# Patient Record
Sex: Male | Born: 1990 | Race: Black or African American | Hispanic: No | Marital: Single | Smoking: Former smoker
Health system: Southern US, Community
[De-identification: ages and names within clinical notes are randomized; demographics above are authoritative.]

## PROBLEM LIST (undated history)

## (undated) DIAGNOSIS — R7303 Prediabetes: Secondary | ICD-10-CM

## (undated) DIAGNOSIS — E559 Vitamin D deficiency, unspecified: Secondary | ICD-10-CM

## (undated) HISTORY — DX: Prediabetes: R73.03

## (undated) HISTORY — PX: HERNIA REPAIR: SHX51

## (undated) HISTORY — DX: Vitamin D deficiency, unspecified: E55.9

---

## 2014-07-15 ENCOUNTER — Emergency Department (HOSPITAL_COMMUNITY)
Admission: EM | Admit: 2014-07-15 | Discharge: 2014-07-15 | Disposition: A | Discharge status: Home / Self Care | Source: Home / Self Care | Attending: Emergency Medicine | Admitting: Emergency Medicine

## 2014-07-15 ENCOUNTER — Ambulatory Visit (HOSPITAL_COMMUNITY): Attending: Emergency Medicine

## 2014-07-15 ENCOUNTER — Encounter (HOSPITAL_COMMUNITY): Payer: Self-pay | Admitting: Emergency Medicine

## 2014-07-15 DIAGNOSIS — R062 Wheezing: Secondary | ICD-10-CM | POA: Diagnosis not present

## 2014-07-15 DIAGNOSIS — R05 Cough: Secondary | ICD-10-CM | POA: Insufficient documentation

## 2014-07-15 DIAGNOSIS — J189 Pneumonia, unspecified organism: Secondary | ICD-10-CM

## 2014-07-15 LAB — POCT RAPID STREP A: Streptococcus, Group A Screen (Direct): NEGATIVE

## 2014-07-15 MED ORDER — PREDNISONE 20 MG PO TABS
ORAL_TABLET | ORAL | Status: AC
  Filled 2014-07-15: qty 3

## 2014-07-15 MED ORDER — PREDNISONE 10 MG PO TABS: ORAL_TABLET | ORAL | Status: DC

## 2014-07-15 MED ORDER — DOXYCYCLINE HYCLATE 100 MG PO CAPS: 100.0000 mg | ORAL_CAPSULE | Freq: Two times a day (BID) | ORAL | Status: DC

## 2014-07-15 MED ORDER — ALBUTEROL SULFATE (2.5 MG/3ML) 0.083% IN NEBU
INHALATION_SOLUTION | RESPIRATORY_TRACT | Status: AC
  Filled 2014-07-15: qty 6

## 2014-07-15 MED ORDER — ALBUTEROL SULFATE HFA 108 (90 BASE) MCG/ACT IN AERS: 2.0000 | INHALATION_SPRAY | RESPIRATORY_TRACT | Status: DC | PRN

## 2014-07-15 MED ORDER — IPRATROPIUM BROMIDE 0.02 % IN SOLN
0.5000 mg | Freq: Once | RESPIRATORY_TRACT | Status: AC
  Administered 2014-07-15: 0.5 mg via RESPIRATORY_TRACT

## 2014-07-15 MED ORDER — ALBUTEROL SULFATE (2.5 MG/3ML) 0.083% IN NEBU
5.0000 mg | INHALATION_SOLUTION | Freq: Once | RESPIRATORY_TRACT | Status: AC
  Administered 2014-07-15: 5 mg via RESPIRATORY_TRACT

## 2014-07-15 MED ORDER — PREDNISONE 20 MG PO TABS
60.0000 mg | ORAL_TABLET | Freq: Once | ORAL | Status: AC
  Administered 2014-07-15: 60 mg via ORAL

## 2014-07-15 MED ORDER — IPRATROPIUM BROMIDE 0.02 % IN SOLN
RESPIRATORY_TRACT | Status: AC
  Filled 2014-07-15: qty 2.5

## 2014-07-15 MED ORDER — AEROCHAMBER PLUS FLO-VU LARGE MISC: 1.0000 | Freq: Once | Status: DC

## 2014-07-15 NOTE — ED Notes (Signed)
Pt started with a sore throat last Friday, about three days later it progressed into nasal congestion and then to a bad cough.  Pt states he has moments of sweating then chills.

## 2014-07-15 NOTE — Discharge Instructions (Signed)

## 2014-07-15 NOTE — ED Provider Notes (Signed)
CSN: [id number]     Arrival date & time 07/15/14  1147 History   First MD Initiated Contact with Patient 07/15/14 1229     Chief Complaint  Patient presents with  . Sore Throat  . Nasal Congestion  . Cough  . Chills   (Consider location/radiation/quality/duration/timing/severity/associated sxs/prior Treatment) HPI          23 year old male presents for evaluation of being sick for one week. This started one week ago with sore throat and progressed quickly into cough and congestion. He feels like his symptoms were getting worse for the first 4 days but have been constant since then. He admits to occasional shortness of breath, worse with lying flat. No chest pain. He has intermittent subjective fevers. He has been taking over-the-counter medication without relief. No recent travel or sick contacts. No past medical history   History reviewed. No pertinent past medical history. History reviewed. No pertinent past surgical history. History reviewed. No pertinent family history. History  Substance Use Topics  . Smoking status: Current Every Day Smoker -- 0.25 packs/day  . Smokeless tobacco: Not on file  . Alcohol Use: Yes     Comment: socially    Review of Systems  Constitutional: Positive for fever, chills and fatigue.  HENT: Positive for congestion, rhinorrhea, sinus pressure and sore throat. Negative for ear pain.   Respiratory: Positive for cough, chest tightness and shortness of breath.   Cardiovascular: Negative for chest pain and palpitations.  Gastrointestinal: Negative for nausea, vomiting, abdominal pain and diarrhea.  Skin: Negative for rash.  All other systems reviewed and are negative.   Allergies  Review of patient's allergies indicates no known allergies.  Home Medications   Prior to Admission medications   Medication Sig Start Date End Date Taking? Authorizing Provider  albuterol (PROVENTIL HFA;VENTOLIN HFA) 108 (90 BASE) MCG/ACT inhaler Inhale 2 puffs into the  lungs every 4 (four) hours as needed for wheezing. 07/15/14   Adrian BlackwaterZachary H Chetan Mehring, PA-C  Chlorphen-Pseudoephed-APAP (THERAFLU FLU/COLD/SORE THROAT PO) Take by mouth.   Yes Historical Provider, MD  doxycycline (VIBRAMYCIN) 100 MG capsule Take 1 capsule (100 mg total) by mouth 2 (two) times daily. 07/15/14   Adrian BlackwaterZachary H Sharman Garrott, PA-C  ibuprofen (ADVIL,MOTRIN) 600 MG tablet Take 600 mg by mouth every 6 (six) hours as needed.   Yes Historical Provider, MD  predniSONE (DELTASONE) 10 MG tablet 4 tabs PO QD for 4 days; 3 tabs PO QD for 3 days; 2 tabs PO QD for 2 days; 1 tab PO QD for 1 day 07/15/14   Graylon GoodZachary H Anasofia Micallef, PA-C  Spacer/Aero-Holding Chambers (AEROCHAMBER PLUS FLO-VU LARGE) MISC 1 each by Other route once. 07/15/14   Adrian BlackwaterZachary H Jerame Hedding, PA-C   BP 124/76 mmHg  Pulse 89  Temp(Src) 99 F (37.2 C) (Oral)  Resp 16  SpO2 99% Physical Exam  Constitutional: He is oriented to person, place, and time. He appears well-developed and well-nourished. No distress.  HENT:  Head: Normocephalic and atraumatic.  Right Ear: External ear normal.  Left Ear: External ear normal.  Nose: Nose normal. Right sinus exhibits no maxillary sinus tenderness and no frontal sinus tenderness. Left sinus exhibits no maxillary sinus tenderness and no frontal sinus tenderness.  Mouth/Throat: Oropharyngeal exudate (erythema, exudate ) present.  Eyes: Conjunctivae are normal. Right eye exhibits no discharge. Left eye exhibits no discharge.  Neck: Normal range of motion. Neck supple.  Cardiovascular: Normal rate, regular rhythm and normal heart sounds.   Pulmonary/Chest: Effort normal. No respiratory distress.  He has wheezes (diffuse). He has rales (LLL). He exhibits no tenderness.  Neurological: He is alert and oriented to person, place, and time. Coordination normal.  Skin: Skin is warm and dry. No rash noted. He is not diaphoretic.  Psychiatric: He has a normal mood and affect. Judgment normal.  Nursing note and vitals  reviewed.   ED Course  Procedures (including critical care time) Labs Review Labs Reviewed  CULTURE, GROUP A STREP  POCT RAPID STREP A ([facility] ONLY)    Imaging Review Dg Chest 2 View  07/15/2014   CLINICAL DATA:  Cough and wheezing for 1 week.  EXAM: CHEST  2 VIEW  COMPARISON:  None.  FINDINGS: The heart size and mediastinal contours are within normal limits. Both lungs are clear. The visualized skeletal structures are unremarkable.  IMPRESSION: Normal exam.   Electronically Signed   By: Geanie CooleyJim  Maxwell M.D.   On: 07/15/2014 14:25     MDM   1. Walking pneumonia    after 10 mg of albuterol and 1 mg of Atrovent, wheezing has resolved slightly but now with significant rhonchi in the left lower lobe. We'll treat for atypical pneumonia with doxycycline. Also prednisone and albuterol. Follow-up if no improvement in a few days   Meds ordered this encounter  Medications  . Chlorphen-Pseudoephed-APAP (THERAFLU FLU/COLD/SORE THROAT PO)    Sig: Take by mouth.  Marland Kitchen. ibuprofen (ADVIL,MOTRIN) 600 MG tablet    Sig: Take 600 mg by mouth every 6 (six) hours as needed.  Marland Kitchen. albuterol (PROVENTIL) (2.5 MG/3ML) 0.083% nebulizer solution 5 mg    Sig:   . ipratropium (ATROVENT) nebulizer solution 0.5 mg    Sig:   . albuterol (PROVENTIL) (2.5 MG/3ML) 0.083% nebulizer solution 5 mg    Sig:   . ipratropium (ATROVENT) nebulizer solution 0.5 mg    Sig:   . predniSONE (DELTASONE) tablet 60 mg    Sig:   . DISCONTD: predniSONE (DELTASONE) 10 MG tablet    Sig: 4 tabs PO QD for 4 days; 3 tabs PO QD for 3 days; 2 tabs PO QD for 2 days; 1 tab PO QD for 1 day    Dispense:  30 tablet    Refill:  0    Order Specific Question:  Supervising Provider    Answer:  Linna HoffKINDL, JAMES D [phone][[id number]]  . DISCONTD: doxycycline (VIBRAMYCIN) 100 MG capsule    Sig: Take 1 capsule (100 mg total) by mouth 2 (two) times daily.    Dispense:  14 capsule    Refill:  0    Order Specific Question:  Supervising Provider    Answer:  Linna HoffKINDL,  JAMES D [phone][[id number]]  . predniSONE (DELTASONE) 10 MG tablet    Sig: 4 tabs PO QD for 4 days; 3 tabs PO QD for 3 days; 2 tabs PO QD for 2 days; 1 tab PO QD for 1 day    Dispense:  30 tablet    Refill:  0    Order Specific Question:  Supervising Provider    Answer:  Linna HoffKINDL, JAMES D [phone][[id number]]  . doxycycline (VIBRAMYCIN) 100 MG capsule    Sig: Take 1 capsule (100 mg total) by mouth 2 (two) times daily.    Dispense:  14 capsule    Refill:  0    Order Specific Question:  Supervising Provider    Answer:  Linna HoffKINDL, JAMES D [phone][[id number]]  . albuterol (PROVENTIL HFA;VENTOLIN HFA) 108 (90 BASE) MCG/ACT inhaler    Sig: Inhale 2 puffs into  the lungs every 4 (four) hours as needed for wheezing.    Dispense:  1 Inhaler    Refill:  0    Order Specific Question:  Supervising Provider    Answer:  Bradd Canary D [id number]  . Spacer/Aero-Holding Chambers (AEROCHAMBER PLUS FLO-VU LARGE) MISC    Sig: 1 each by Other route once.    Dispense:  1 each    Refill:  0    Order Specific Question:  Supervising Provider    Answer:  Bradd Canary D [[id number]]     Graylon Good, PA-C 07/15/14 [phone]

## 2014-07-17 LAB — CULTURE, GROUP A STREP

## 2015-10-22 IMAGING — CR DG CHEST 2V
2 series · 2 of 2 positions shown · non-contrast
Comparison: None.

CLINICAL DATA: Cough and wheezing for 1 week.

EXAM:
CHEST  2 VIEW

[w chest pa]
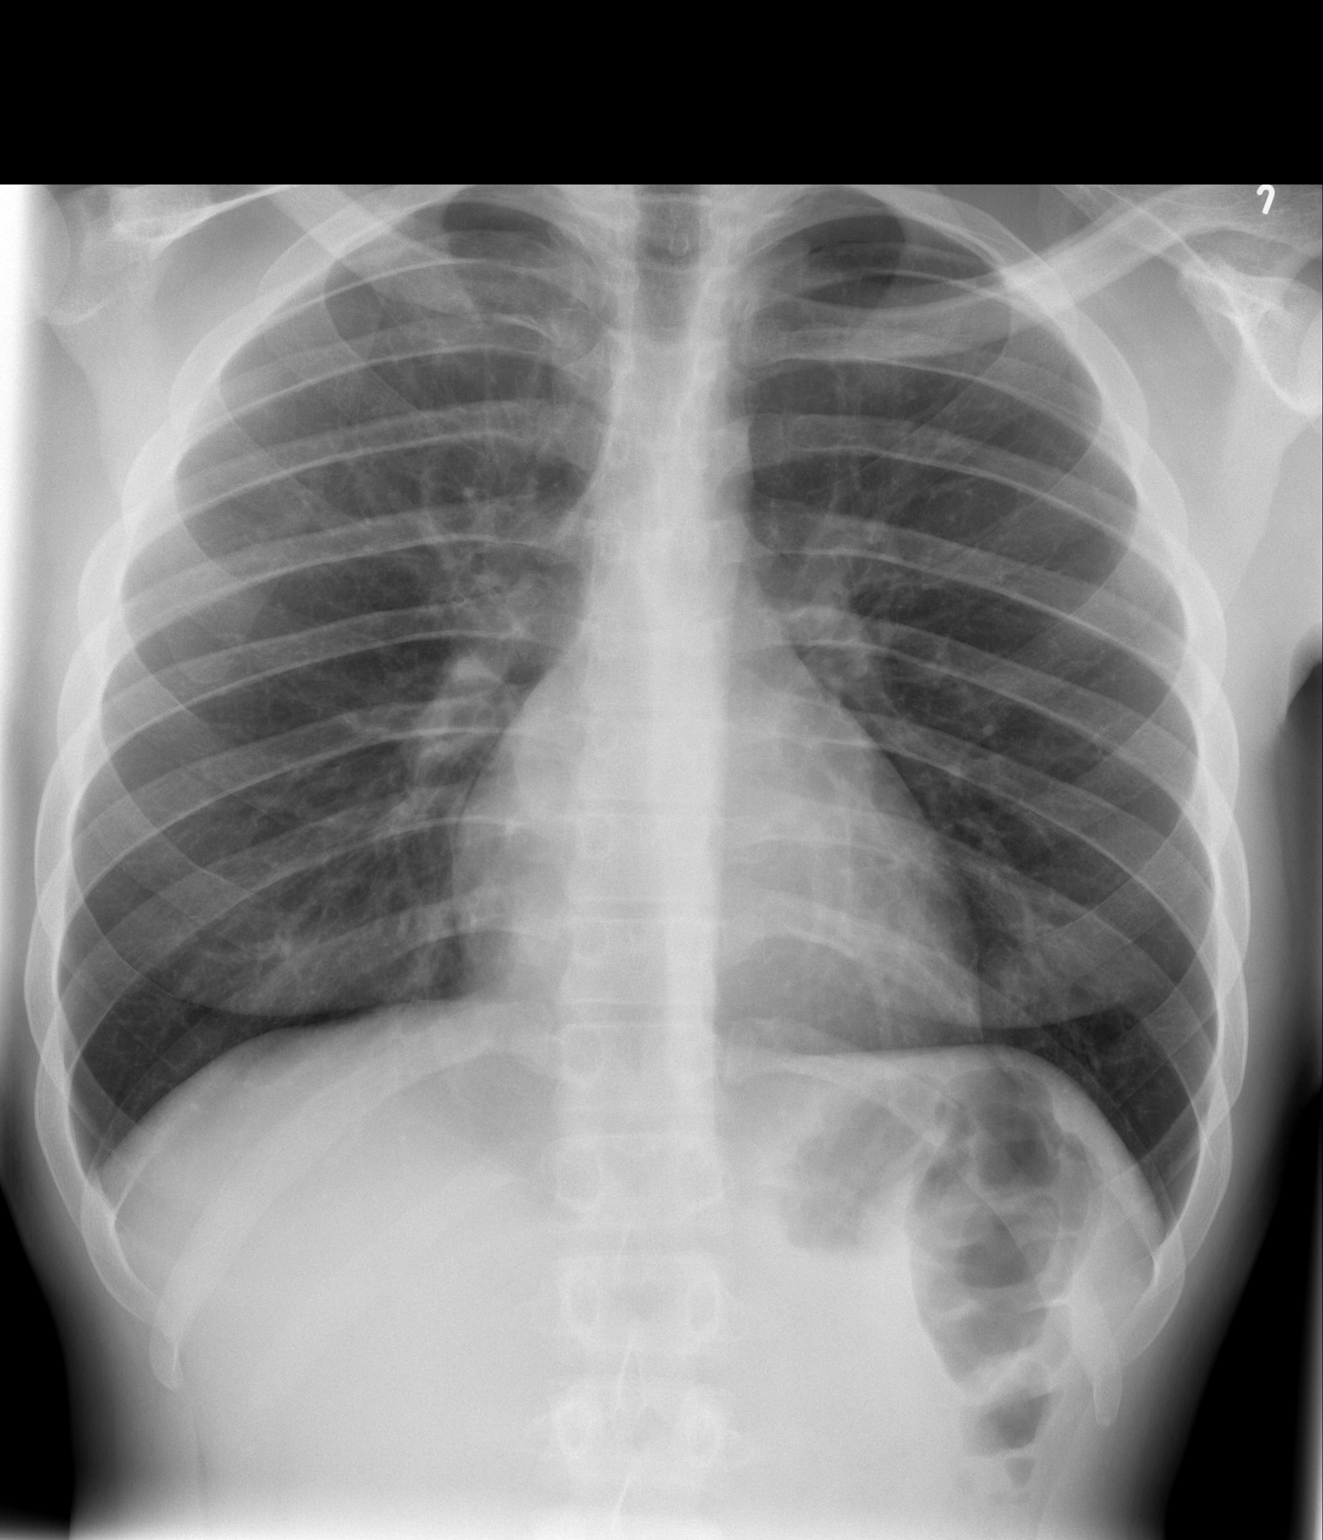

[w chest lat]
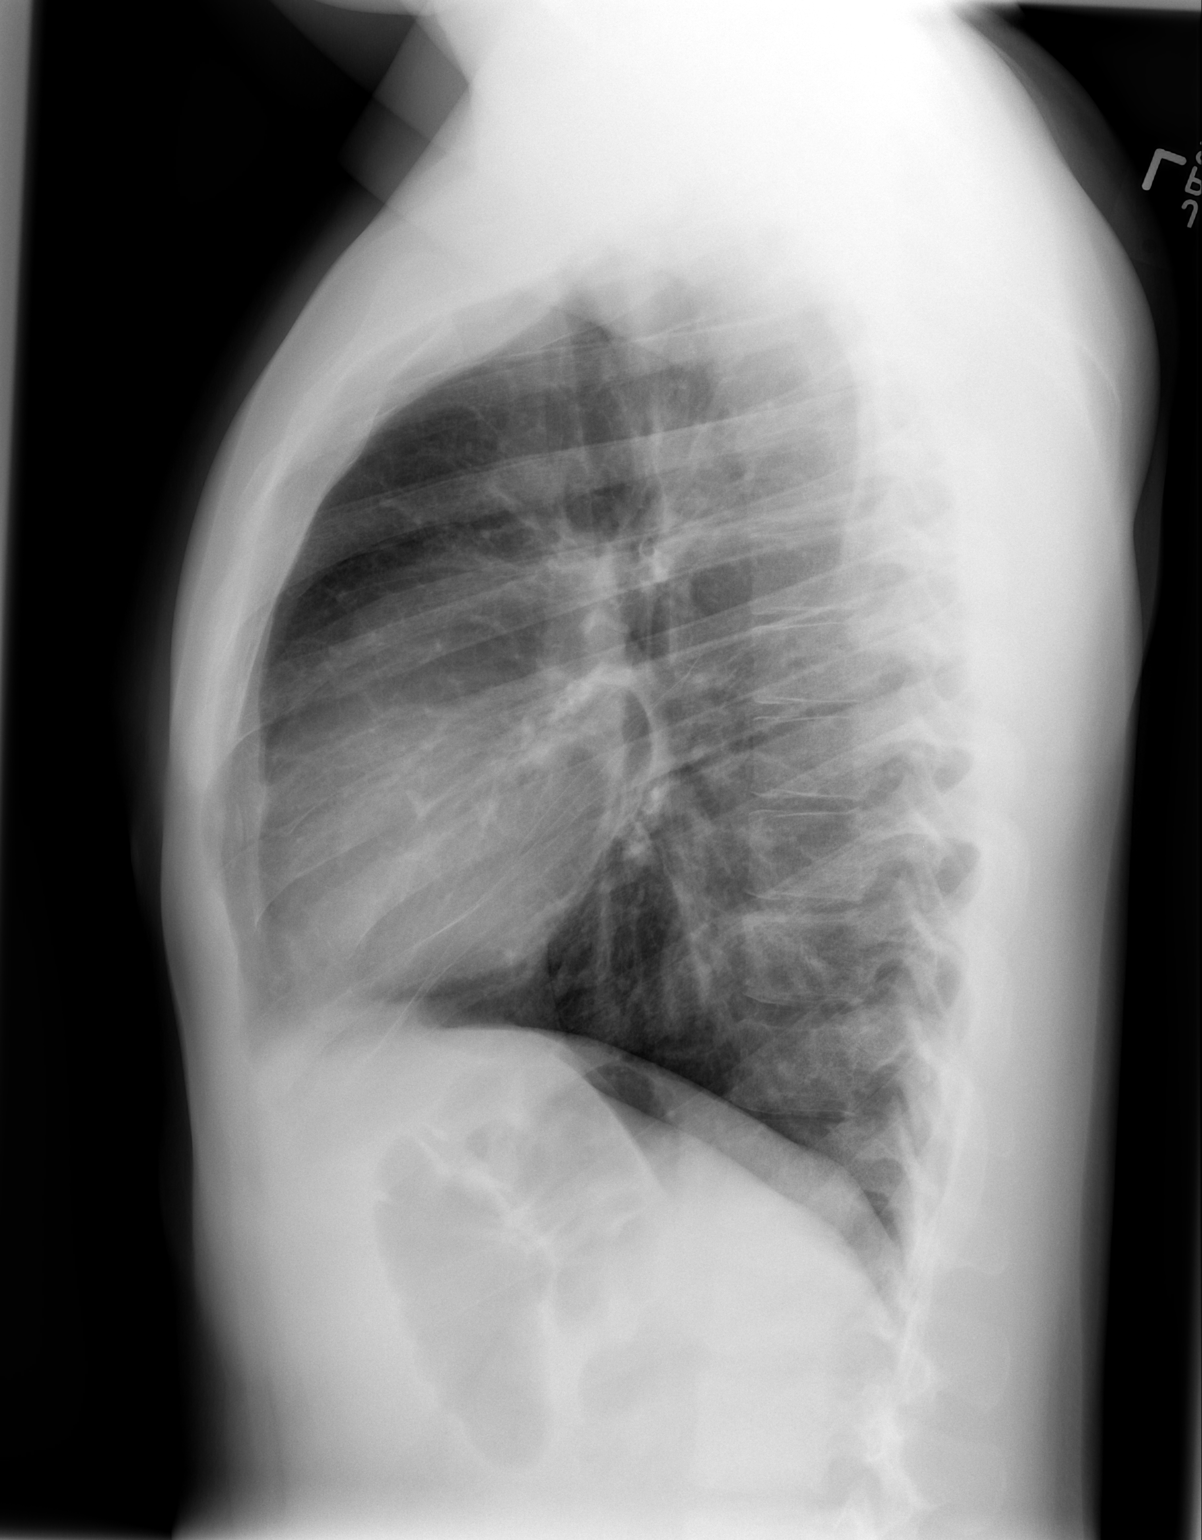

[2 of 2 positions shown; findings below may reference images not displayed]

FINDINGS: The heart size and mediastinal contours are within normal limits.
Both lungs are clear. The visualized skeletal structures are
unremarkable.
IMPRESSION: Normal exam.

## 2016-12-27 ENCOUNTER — Ambulatory Visit (HOSPITAL_COMMUNITY)
Admission: EM | Admit: 2016-12-27 | Discharge: 2016-12-27 | Disposition: A | Discharge status: Home / Self Care | Payer: PPO | Attending: Family Medicine | Admitting: Family Medicine

## 2016-12-27 ENCOUNTER — Encounter (HOSPITAL_COMMUNITY): Payer: Self-pay | Admitting: *Deleted

## 2016-12-27 DIAGNOSIS — A084 Viral intestinal infection, unspecified: Secondary | ICD-10-CM

## 2016-12-27 MED ORDER — ONDANSETRON 4 MG PO TBDP
4.0000 mg | ORAL_TABLET | Freq: Once | ORAL | Status: AC
  Administered 2016-12-27: 4 mg via ORAL

## 2016-12-27 MED ORDER — ONDANSETRON 4 MG PO TBDP
ORAL_TABLET | ORAL | Status: AC
  Filled 2016-12-27: qty 1

## 2016-12-27 MED ORDER — ONDANSETRON HCL 8 MG PO TABS: 8.0000 mg | ORAL_TABLET | Freq: Three times a day (TID) | ORAL | 0 refills | Status: DC | PRN

## 2016-12-27 NOTE — ED Triage Notes (Signed)
Pt  Reports   Symptoms  Of  Nausea    With   Decreased   Appetite   X  3  Days  Pt  Has  Dry  Heaves    No  Diarrhea      As  A  Matter  Of fact     Pt    Reports  Has  Not had  A  bm in  3  Days     Pt  Is  Awake  And  Alert

## 2016-12-27 NOTE — ED Provider Notes (Signed)
[facility]    CSN: [id number] Arrival date & time: 12/27/16  1000     History   Chief Complaint Chief Complaint  Patient presents with  . Nausea    HPI Kenneth Chang is a 26 y.o. male presenting for nausea.   He reports 3 days of intermittent nausea with only 1 associated episode of vomiting (nonbloody nonbilious). He has been able to hold down liquids and some doritos. No abd pain, diarrhea or constipation. No sick contacts or suspicious ingestions. Smokes tobacco daily, drinks occasionally and smokes "a lot" of pot daily.   HPI  History reviewed. No pertinent past medical history.  There are no active problems to display for this patient.   History reviewed. No pertinent surgical history.     Home Medications    Prior to Admission medications   Medication Sig Start Date End Date Taking? Authorizing Provider  albuterol (PROVENTIL HFA;VENTOLIN HFA) 108 (90 BASE) MCG/ACT inhaler Inhale 2 puffs into the lungs every 4 (four) hours as needed for wheezing. 07/15/14   Adrian Blackwater Baker, PA-C  ibuprofen (ADVIL,MOTRIN) 600 MG tablet Take 600 mg by mouth every 6 (six) hours as needed.    Historical Provider, MD  ondansetron (ZOFRAN) 8 MG tablet Take 1 tablet (8 mg total) by mouth every 8 (eight) hours as needed for nausea or vomiting. 12/27/16   Tyrone Nine, MD    Family History No family history on file.  Social History Social History  Substance Use Topics  . Smoking status: Current Every Day Smoker    Packs/day: 0.25  . Smokeless tobacco: Not on file  . Alcohol use Yes     Comment: socially     Allergies   Patient has no known allergies.   Review of Systems Review of Systems Per HPI  Physical Exam Triage Vital Signs ED Triage Vitals [12/27/16 1015]  Enc Vitals Group     BP (!) 158/70     Pulse Rate (!) 58     Resp 18     Temp 98.6 F (37 C)     Temp Source Oral     SpO2 99 %     Weight      Height      Head Circumference      Peak Flow       Pain Score      Pain Loc      Pain Edu?      Excl. in GC?    No data found.   Updated Vital Signs BP (!) 158/70 (BP Location: Right Arm)   Pulse (!) 58   Temp 98.6 F (37 C) (Oral)   Resp 18   SpO2 99%   Visual Acuity Right Eye Distance:   Left Eye Distance:   Bilateral Distance:    Right Eye Near:   Left Eye Near:    Bilateral Near:     Physical Exam  Constitutional: He is oriented to person, place, and time. He appears well-developed and well-nourished. No distress.  Eyes: EOM are normal. Pupils are equal, round, and reactive to light. No scleral icterus.  Neck: Neck supple. No JVD present.  Cardiovascular: Normal rate, regular rhythm, normal heart sounds and intact distal pulses.   No murmur heard. Pulmonary/Chest: Effort normal and breath sounds normal. No respiratory distress.  Abdominal: Soft. Bowel sounds are normal. He exhibits no distension. There is no tenderness.  Musculoskeletal: Normal range of motion. He exhibits no edema or tenderness.  Lymphadenopathy:  He has no cervical adenopathy.  Neurological: He is alert and oriented to person, place, and time. He exhibits normal muscle tone.  Skin: Skin is warm and dry.  Vitals reviewed.    UC Treatments / Results  Labs (all labs ordered are listed, but only abnormal results are displayed) Labs Reviewed - No data to display  EKG  EKG Interpretation None       Radiology No results found.  Procedures Procedures (including critical care time)  Medications Ordered in UC Medications  ondansetron (ZOFRAN-ODT) disintegrating tablet 4 mg (not administered)     Initial Impression / Assessment and Plan / UC Course  I have reviewed the triage vital signs and the nursing notes.  Pertinent labs & imaging results that were available during my care of the patient were reviewed by me and considered in my medical decision making (see chart for details).  Final Clinical Impressions(s) / UC Diagnoses     Final diagnoses:  Viral gastroenteritis   26 y.o. male presenting for viral gastroenteritis symptoms without evidence of dysentery. No evidence of dehydration with stable vital signs and benign exam. Also discussed possibility of cannabinoid hyperemesis with the patient. Though this is not the most likely diagnosis, it was recommended that he quit tobacco and marijuana.  - Zofran provided in clinic with relief and prescription provided.  - Hydration precautions given.   New Prescriptions New Prescriptions   ONDANSETRON (ZOFRAN) 8 MG TABLET    Take 1 tablet (8 mg total) by mouth every 8 (eight) hours as needed for nausea or vomiting.     Tyrone Nineyan B Ellington Cornia, MD 12/27/16 1037

## 2016-12-31 ENCOUNTER — Emergency Department (HOSPITAL_COMMUNITY)
Admission: EM | Admit: 2016-12-31 | Discharge: 2016-12-31 | Disposition: A | Discharge status: Home / Self Care | Payer: BLUE CROSS/BLUE SHIELD | Attending: Emergency Medicine | Admitting: Emergency Medicine

## 2016-12-31 ENCOUNTER — Encounter (HOSPITAL_COMMUNITY): Payer: Self-pay | Admitting: *Deleted

## 2016-12-31 DIAGNOSIS — I10 Essential (primary) hypertension: Secondary | ICD-10-CM | POA: Diagnosis not present

## 2016-12-31 DIAGNOSIS — R112 Nausea with vomiting, unspecified: Secondary | ICD-10-CM | POA: Diagnosis present

## 2016-12-31 DIAGNOSIS — F17200 Nicotine dependence, unspecified, uncomplicated: Secondary | ICD-10-CM | POA: Diagnosis not present

## 2016-12-31 DIAGNOSIS — Z79899 Other long term (current) drug therapy: Secondary | ICD-10-CM | POA: Diagnosis not present

## 2016-12-31 DIAGNOSIS — E876 Hypokalemia: Secondary | ICD-10-CM | POA: Diagnosis not present

## 2016-12-31 LAB — COMPREHENSIVE METABOLIC PANEL
ALT: 14 U/L — ABNORMAL LOW (ref 17–63)
AST: 13 U/L — ABNORMAL LOW (ref 15–41)
Albumin: 4.4 g/dL (ref 3.5–5.0)
Alkaline Phosphatase: 47 U/L (ref 38–126)
Anion gap: 13 (ref 5–15)
BUN: 16 mg/dL (ref 6–20)
CALCIUM: 9.5 mg/dL (ref 8.9–10.3)
CHLORIDE: 98 mmol/L — AB (ref 101–111)
CO2: 23 mmol/L (ref 22–32)
Creatinine, Ser: 1.14 mg/dL (ref 0.61–1.24)
GFR calc non Af Amer: >60 mL/min (ref >60)
Glucose, Bld: 111 mg/dL — ABNORMAL HIGH (ref 65–99)
Potassium: 3.1 mmol/L — ABNORMAL LOW (ref 3.5–5.1)
SODIUM: 134 mmol/L — AB (ref 135–145)
Total Bilirubin: 1 mg/dL (ref 0.3–1.2)
Total Protein: 7.4 g/dL (ref 6.5–8.1)

## 2016-12-31 LAB — CBC
HCT: 44.3 % (ref 39.0–52.0)
Hemoglobin: 15.4 g/dL (ref 13.0–17.0)
MCH: 26.5 pg (ref 26.0–34.0)
MCHC: 34.8 g/dL (ref 30.0–36.0)
MCV: 76.1 fL — AB (ref 78.0–100.0)
Platelets: 257 10*3/uL (ref 150–400)
RBC: 5.82 MIL/uL — AB (ref 4.22–5.81)
RDW: 12.9 % (ref 11.5–15.5)
WBC: 7.6 10*3/uL (ref 4.0–10.5)

## 2016-12-31 LAB — LIPASE, BLOOD: Lipase: 37 U/L (ref 11–51)

## 2016-12-31 MED ORDER — PROMETHAZINE HCL 25 MG PO TABS: 25.0000 mg | ORAL_TABLET | Freq: Four times a day (QID) | ORAL | 0 refills | Status: DC | PRN

## 2016-12-31 MED ORDER — POTASSIUM CHLORIDE CRYS ER 20 MEQ PO TBCR
20.0000 meq | EXTENDED_RELEASE_TABLET | Freq: Once | ORAL | Status: AC
  Administered 2016-12-31: 20 meq via ORAL
  Filled 2016-12-31: qty 1

## 2016-12-31 MED ORDER — PROMETHAZINE HCL 25 MG/ML IJ SOLN
25.0000 mg | Freq: Once | INTRAMUSCULAR | Status: AC
  Administered 2016-12-31: 25 mg via INTRAVENOUS
  Filled 2016-12-31: qty 1

## 2016-12-31 MED ORDER — SODIUM CHLORIDE 0.9 % IV BOLUS (SEPSIS)
1000.0000 mL | Freq: Once | INTRAVENOUS | Status: AC
  Administered 2016-12-31: 1000 mL via INTRAVENOUS

## 2016-12-31 NOTE — ED Notes (Signed)
Aware he needs to get a urine spec. Unable to do so now

## 2016-12-31 NOTE — ED Notes (Signed)
Pt did not need anything at this time  

## 2016-12-31 NOTE — ED Provider Notes (Addendum)
[facility]   CSN: [id number] Arrival date & time: 12/31/16  4098     History   Chief Complaint Chief Complaint  Patient presents with  . Abdominal Pain  . Nausea  . Emesis    HPI Phoenyx Paulsen is a 26 y.o. male.  HPI  26 year old male presents today complaining of nausea and vomiting for the past week. He denies any pain, fever, body aches, or diarrhea. He has not had similar episodes in the past. He describes nonbloody nonbilious emesis  he was seen in urgent care earlier in the week for this and given Zofran which has not relieved him. He denies any headache, chest pain, dyspnea, cough, flank pain, urinary tract infection symptoms, or rashes. No known sick contacts. He does endorse smoking marijuana heavily on a daily basis.  History reviewed. No pertinent past medical history.  There are no active problems to display for this patient.   Past Surgical History:  Procedure Laterality Date  . HERNIA REPAIR         Home Medications    Prior to Admission medications   Medication Sig Start Date End Date Taking? Authorizing Provider  albuterol (PROVENTIL HFA;VENTOLIN HFA) 108 (90 BASE) MCG/ACT inhaler Inhale 2 puffs into the lungs every 4 (four) hours as needed for wheezing. 07/15/14   Baker, Adrian Blackwater, PA-C  ibuprofen (ADVIL,MOTRIN) 600 MG tablet Take 600 mg by mouth every 6 (six) hours as needed.    [provider]  ondansetron (ZOFRAN) 8 MG tablet Take 1 tablet (8 mg total) by mouth every 8 (eight) hours as needed for nausea or vomiting. 12/27/16   Tyrone Nine, MD    Family History No family history on file.  Social History Social History  Substance Use Topics  . Smoking status: Current Every Day Smoker    Packs/day: 0.25  . Smokeless tobacco: Never Used  . Alcohol use Yes     Comment: socially     Allergies   Patient has no known allergies.   Review of Systems Review of Systems  All other systems reviewed and are  negative.    Physical Exam Updated Vital Signs BP (!) 161/98 (BP Location: Left Arm)   Pulse (!) 58   Temp 98.8 F (37.1 C) (Oral)   Resp 16   SpO2 100%   Physical Exam  Constitutional: He is oriented to person, place, and time. He appears well-developed and well-nourished. He appears distressed.  HENT:  Head: Normocephalic.  Eyes: Pupils are equal, round, and reactive to light.  Neck: Normal range of motion. Neck supple.  Cardiovascular: Normal rate and regular rhythm.   Pulmonary/Chest: Effort normal and breath sounds normal.  Abdominal: Soft. Bowel sounds are normal. He exhibits no distension and no mass. There is no tenderness. There is no guarding.  Musculoskeletal: Normal range of motion.  Neurological: He is alert and oriented to person, place, and time.  Skin: Skin is warm. Capillary refill takes less than 2 seconds.  Psychiatric: He has a normal mood and affect.  Nursing note and vitals reviewed.    ED Treatments / Results  Labs (all labs ordered are listed, but only abnormal results are displayed) Labs Reviewed  CBC - Abnormal; Notable for the following:       Result Value   RBC 5.82 (*)    MCV 76.1 (*)    All other components within normal limits  LIPASE, BLOOD  COMPREHENSIVE METABOLIC PANEL  URINALYSIS, ROUTINE W REFLEX MICROSCOPIC  EKG  EKG Interpretation None       Radiology No results found.  Procedures Procedures (including critical care time)  Medications Ordered in ED Medications  sodium chloride 0.9 % bolus 1,000 mL (not administered)  promethazine (PHENERGAN) injection 25 mg (not administered)     Initial Impression / Assessment and Plan / ED Course  I have reviewed the triage vital signs and the nursing notes.  Pertinent labs & imaging results that were available during my care of the patient were reviewed by me and considered in my medical decision making (see chart for details).   26 year old male presents complaining of  nausea and vomiting for a week. He is hydrated here with a liter of normal saline. Labs are obtained and reviewed. His custody 1 use and need for cessation assistance may represent cannabinoid hyperemesis syndrome.  Patient with systolic blood pressures up to 160. These have been present on multiple repeat evaluations. Patient advised to have outpatient follow-up for hypertension. Patient tolerating fluids well.  Plan oral potassium repletion for mild hypokalemia f/b dietary repletion.  Plan phenergan, clear liquids, thc cessation.  Patient voices understanding.  Final Clinical Impressions(s) / ED Diagnoses   Final diagnoses:  Nausea and vomiting, intractability of vomiting not specified, unspecified vomiting type  Hypokalemia  Hypertension, unspecified type    New Prescriptions New Prescriptions   No medications on file     Margarita Grizzleay, Vernetta Dizdarevic, MD 12/31/16 [id number]    Margarita Grizzleay, Murl Zogg, MD 12/31/16 [phone]

## 2016-12-31 NOTE — ED Notes (Signed)
PT did not have to use RR at this time  

## 2016-12-31 NOTE — ED Triage Notes (Signed)
c/o nausea and vomiting onset 1 week ago states he was seen at urgent care for same , was given zofran not helping.

## 2016-12-31 NOTE — ED Notes (Signed)
Pt states he understands instructions. Home stable with girtlfriend.

## 2016-12-31 NOTE — ED Notes (Signed)
Taking po fluids and tolerating well.

## 2017-04-02 ENCOUNTER — Emergency Department (HOSPITAL_COMMUNITY)
Admission: EM | Admit: 2017-04-02 | Discharge: 2017-04-02 | Disposition: A | Discharge status: Home / Self Care | Payer: Self-pay

## 2017-04-04 ENCOUNTER — Encounter (HOSPITAL_COMMUNITY): Payer: Self-pay | Admitting: Nurse Practitioner

## 2017-04-04 DIAGNOSIS — F12988 Cannabis use, unspecified with other cannabis-induced disorder: Secondary | ICD-10-CM | POA: Insufficient documentation

## 2017-04-04 DIAGNOSIS — F17200 Nicotine dependence, unspecified, uncomplicated: Secondary | ICD-10-CM | POA: Diagnosis not present

## 2017-04-04 DIAGNOSIS — R112 Nausea with vomiting, unspecified: Secondary | ICD-10-CM | POA: Diagnosis present

## 2017-04-04 LAB — COMPREHENSIVE METABOLIC PANEL
ALBUMIN: 4.3 g/dL (ref 3.5–5.0)
ALK PHOS: 40 U/L (ref 38–126)
ALT: 37 U/L (ref 17–63)
AST: 83 U/L — AB (ref 15–41)
Anion gap: 13 (ref 5–15)
BILIRUBIN TOTAL: 1.3 mg/dL — AB (ref 0.3–1.2)
BUN: 18 mg/dL (ref 6–20)
CALCIUM: 9.5 mg/dL (ref 8.9–10.3)
CO2: 24 mmol/L (ref 22–32)
Chloride: 103 mmol/L (ref 101–111)
Creatinine, Ser: 1.44 mg/dL — ABNORMAL HIGH (ref 0.61–1.24)
GFR calc Af Amer: >60 mL/min (ref >60)
GFR calc non Af Amer: >60 mL/min (ref >60)
GLUCOSE: 119 mg/dL — AB (ref 65–99)
Potassium: 3.3 mmol/L — ABNORMAL LOW (ref 3.5–5.1)
SODIUM: 140 mmol/L (ref 135–145)
TOTAL PROTEIN: 6.6 g/dL (ref 6.5–8.1)

## 2017-04-04 LAB — CBC
HCT: 44.2 % (ref 39.0–52.0)
Hemoglobin: 15.4 g/dL (ref 13.0–17.0)
MCH: 26.6 pg (ref 26.0–34.0)
MCHC: 34.8 g/dL (ref 30.0–36.0)
MCV: 76.2 fL — ABNORMAL LOW (ref 78.0–100.0)
Platelets: 253 10*3/uL (ref 150–400)
RBC: 5.8 MIL/uL (ref 4.22–5.81)
RDW: 13.5 % (ref 11.5–15.5)
WBC: 9.3 10*3/uL (ref 4.0–10.5)

## 2017-04-04 LAB — LIPASE, BLOOD: Lipase: 22 U/L (ref 11–51)

## 2017-04-04 NOTE — ED Triage Notes (Signed)
Pt states for the last 5 days, he has experiencing cramping abdominal pain. Has been unable to keep anything down and lost appetite.

## 2017-04-05 ENCOUNTER — Emergency Department (HOSPITAL_COMMUNITY)
Admission: EM | Admit: 2017-04-05 | Discharge: 2017-04-05 | Disposition: A | Discharge status: Home / Self Care | Payer: BLUE CROSS/BLUE SHIELD | Attending: Emergency Medicine | Admitting: Emergency Medicine

## 2017-04-05 DIAGNOSIS — F12988 Cannabis use, unspecified with other cannabis-induced disorder: Secondary | ICD-10-CM

## 2017-04-05 DIAGNOSIS — R112 Nausea with vomiting, unspecified: Secondary | ICD-10-CM

## 2017-04-05 LAB — URINALYSIS, ROUTINE W REFLEX MICROSCOPIC
Bacteria, UA: NONE SEEN
GLUCOSE, UA: NEGATIVE mg/dL
HGB URINE DIPSTICK: NEGATIVE
Ketones, ur: 80 mg/dL — AB
LEUKOCYTES UA: NEGATIVE
NITRITE: NEGATIVE
Protein, ur: 100 mg/dL — AB
Specific Gravity, Urine: 1.04 — ABNORMAL HIGH (ref 1.005–1.030)
pH: 5 (ref 5.0–8.0)

## 2017-04-05 MED ORDER — ONDANSETRON HCL 4 MG/2ML IJ SOLN
4.0000 mg | Freq: Once | INTRAMUSCULAR | Status: AC
  Administered 2017-04-05: 4 mg via INTRAVENOUS
  Filled 2017-04-05: qty 2

## 2017-04-05 MED ORDER — METOCLOPRAMIDE HCL 5 MG/ML IJ SOLN
10.0000 mg | Freq: Once | INTRAMUSCULAR | Status: AC
  Administered 2017-04-05: 10 mg via INTRAVENOUS
  Filled 2017-04-05: qty 2

## 2017-04-05 MED ORDER — METOCLOPRAMIDE HCL 10 MG PO TABS: 10.0000 mg | ORAL_TABLET | Freq: Four times a day (QID) | ORAL | 0 refills | Status: DC

## 2017-04-05 MED ORDER — SODIUM CHLORIDE 0.9 % IV BOLUS (SEPSIS)
2000.0000 mL | Freq: Once | INTRAVENOUS | Status: AC
  Administered 2017-04-05: 2000 mL via INTRAVENOUS

## 2017-04-05 NOTE — ED Provider Notes (Signed)
[facility] Note   CSN: [id number] Arrival date & time: 04/04/17  2017     History   Chief Complaint Chief Complaint  Patient presents with  . N/V/D  . Abdominal Pain    HPI Kenneth Chang is a 26 y.o. male.  Patient with a history of cannabinoid hyperemesis presents with complaint of nausea and vomiting for the past 5 days. No fever. He reports intermittent cramping type abdominal pain. No hematemesis. He quit smoking marijuana 2 days ago.    The history is provided by the patient. No language interpreter was used.    History reviewed. No pertinent past medical history.  There are no active problems to display for this patient.   Past Surgical History:  Procedure Laterality Date  . HERNIA REPAIR         Home Medications    Prior to Admission medications   Medication Sig Start Date End Date Taking? Authorizing Provider  albuterol (PROVENTIL HFA;VENTOLIN HFA) 108 (90 BASE) MCG/ACT inhaler Inhale 2 puffs into the lungs every 4 (four) hours as needed for wheezing. Patient not taking: Reported on 12/31/2016 07/15/14   Autumn MessingBaker, Zachary H, PA-C  ondansetron (ZOFRAN) 8 MG tablet Take 1 tablet (8 mg total) by mouth every 8 (eight) hours as needed for nausea or vomiting. 12/27/16   Tyrone NineGrunz, Ryan B, MD  promethazine (PHENERGAN) 25 MG tablet Take 1 tablet (25 mg total) by mouth every 6 (six) hours as needed for nausea or vomiting. 12/31/16   Margarita Grizzleay, Danielle, MD    Family History History reviewed. No pertinent family history.  Social History Social History  Substance Use Topics  . Smoking status: Current Every Day Smoker    Packs/day: 0.25  . Smokeless tobacco: Never Used  . Alcohol use Yes     Comment: socially     Allergies   Patient has no known allergies.   Review of Systems Review of Systems  Constitutional: Negative for chills and fever.  Gastrointestinal: Positive for abdominal pain, nausea and vomiting.  Genitourinary: Negative.  Negative for  decreased urine volume.  Musculoskeletal: Negative.   Skin: Negative.   Neurological: Negative.      Physical Exam Updated Vital Signs BP (!) 150/100 (BP Location: Right Arm)   Pulse (!) 55   Temp 99 F (37.2 C) (Oral)   Resp 20   SpO2 100%   Physical Exam  Constitutional: He is oriented to person, place, and time. He appears well-developed and well-nourished.  HENT:  Head: Normocephalic.  Neck: Normal range of motion. Neck supple.  Cardiovascular: Normal rate and regular rhythm.   Pulmonary/Chest: Effort normal and breath sounds normal. He has no wheezes. He has no rales.  Abdominal: Soft. Bowel sounds are normal. There is no tenderness. There is no rebound and no guarding.  Musculoskeletal: Normal range of motion.  Neurological: He is alert and oriented to person, place, and time.  Skin: Skin is warm and dry. No rash noted.  Psychiatric: He has a normal mood and affect.     ED Treatments / Results  Labs (all labs ordered are listed, but only abnormal results are displayed) Labs Reviewed  COMPREHENSIVE METABOLIC PANEL - Abnormal; Notable for the following:       Result Value   Potassium 3.3 (*)    Glucose, Bld 119 (*)    Creatinine, Ser 1.44 (*)    AST 83 (*)    Total Bilirubin 1.3 (*)    All other components within normal limits  CBC - Abnormal; Notable for the following:    MCV 76.2 (*)    All other components within normal limits  URINALYSIS, ROUTINE W REFLEX MICROSCOPIC - Abnormal; Notable for the following:    Color, Urine AMBER (*)    APPearance HAZY (*)    Specific Gravity, Urine 1.040 (*)    Bilirubin Urine SMALL (*)    Ketones, ur 80 (*)    Protein, ur 100 (*)    Squamous Epithelial / LPF 0-5 (*)    All other components within normal limits  LIPASE, BLOOD    EKG  EKG Interpretation None       Radiology No results found.  Procedures Procedures (including critical care time)  Medications Ordered in ED Medications  sodium chloride 0.9 %  bolus 2,000 mL (not administered)  ondansetron (ZOFRAN) injection 4 mg (not administered)     Initial Impression / Assessment and Plan / ED Course  I have reviewed the triage vital signs and the nursing notes.  Pertinent labs & imaging results that were available during my care of the patient were reviewed by me and considered in my medical decision making (see chart for details).     Patient presents with nausea and vomiting x 5 days, with history of cannabinoid hyperemesis. He is will appearing. VSS.   He is given 2 L fluids with Zofran, and then Reglan with control of nausea. He is tolerating PO fluids without further vomiting.   He can be discharged home with Rx Reglan.  Final Clinical Impressions(s) / ED Diagnoses   Final diagnoses:  None   1. Cannabinoid hyperemesis  New Prescriptions New Prescriptions   No medications on file     Danne Harbor 04/05/17 1610    Kenneth Booze, MD 04/05/17 [phone]

## 2017-04-05 NOTE — ED Notes (Signed)
Pt was given ginger ale for po challenge. 

## 2018-08-20 ENCOUNTER — Encounter (HOSPITAL_COMMUNITY): Payer: Self-pay

## 2018-08-20 ENCOUNTER — Emergency Department (HOSPITAL_COMMUNITY): Payer: BLUE CROSS/BLUE SHIELD

## 2018-08-20 ENCOUNTER — Other Ambulatory Visit: Payer: Self-pay

## 2018-08-20 ENCOUNTER — Emergency Department (HOSPITAL_COMMUNITY)
Admission: EM | Admit: 2018-08-20 | Discharge: 2018-08-20 | Disposition: A | Discharge status: Home / Self Care | Payer: BLUE CROSS/BLUE SHIELD | Attending: Emergency Medicine | Admitting: Emergency Medicine

## 2018-08-20 DIAGNOSIS — Z79899 Other long term (current) drug therapy: Secondary | ICD-10-CM | POA: Diagnosis not present

## 2018-08-20 DIAGNOSIS — R05 Cough: Secondary | ICD-10-CM | POA: Diagnosis present

## 2018-08-20 DIAGNOSIS — J111 Influenza due to unidentified influenza virus with other respiratory manifestations: Secondary | ICD-10-CM | POA: Diagnosis not present

## 2018-08-20 DIAGNOSIS — R69 Illness, unspecified: Secondary | ICD-10-CM

## 2018-08-20 DIAGNOSIS — F17200 Nicotine dependence, unspecified, uncomplicated: Secondary | ICD-10-CM | POA: Diagnosis not present

## 2018-08-20 LAB — INFLUENZA PANEL BY PCR (TYPE A & B)
Influenza A By PCR: NEGATIVE
Influenza B By PCR: NEGATIVE

## 2018-08-20 NOTE — ED Notes (Signed)
Pt transported to XR.  

## 2018-08-20 NOTE — ED Notes (Signed)
Actual Oral temperature 100.2

## 2018-08-20 NOTE — ED Triage Notes (Signed)
Arrived POV c/o body aches, chills, & cough has taken alka seltzer without relief

## 2018-08-20 NOTE — Discharge Instructions (Signed)
Your evaluated today for body aches and pains, fever and cough.  Your chest x-ray was negative.  Your influenza screen was obtained, however this will take hours to process.  You are outside the treatment window for Tamiflu.  I would suggest Tylenol, ibuprofen for your symptoms.  Make sure you hydrate well and rest.  Not return to work until you are 24 hours without a fever without using Tylenol.  Follow-up with your PCP for reevaluation.  Return to the ED for any new or worsening symptoms.

## 2018-08-20 NOTE — ED Provider Notes (Signed)
[facility] Provider Note   CSN: [id number] Arrival date & time: 08/20/18  1817   History   Chief Complaint Chief Complaint  Patient presents with  . Cough  . Fever    HPI Kenneth Chang is a 27 y.o. male with no significant past medical history who presents for evaluation of fever, cough.  Patient states symptom onset approximately 4 days ago.  Patient states "I feel like I was hit by bus."  Admits to rhinorrhea without congestion.  States he has had multiple sick contacts.  Has not taken anything for symptoms.  Did not receive influenza vaccine.  Denies chills, sore throat, chest pain, shortness of breath, hemoptysis, abdominal pain, diarrhea, dysuria.  Denies additional aggravating or alleviating factors.  History obtained from patient.  No interpreter was used.  HPI  History reviewed. No pertinent past medical history.  There are no active problems to display for this patient.   Past Surgical History:  Procedure Laterality Date  . HERNIA REPAIR          Home Medications    Prior to Admission medications   Medication Sig Start Date End Date Taking? Authorizing Provider  albuterol (PROVENTIL HFA;VENTOLIN HFA) 108 (90 BASE) MCG/ACT inhaler Inhale 2 puffs into the lungs every 4 (four) hours as needed for wheezing. Patient not taking: Reported on 12/31/2016 07/15/14   Graylon GoodBaker, Zachary H, PA-C  metoCLOPramide (REGLAN) 10 MG tablet Take 1 tablet (10 mg total) by mouth every 6 (six) hours. 04/05/17   Elpidio AnisUpstill, Shari, PA-C  ondansetron (ZOFRAN) 8 MG tablet Take 1 tablet (8 mg total) by mouth every 8 (eight) hours as needed for nausea or vomiting. Patient not taking: Reported on 04/05/2017 12/27/16   Tyrone NineGrunz, Ryan B, MD  promethazine (PHENERGAN) 25 MG tablet Take 1 tablet (25 mg total) by mouth every 6 (six) hours as needed for nausea or vomiting. Patient not taking: Reported on 04/05/2017 12/31/16   Margarita Grizzleay, Danielle, MD    Family History History  reviewed. No pertinent family history.  Social History Social History   Tobacco Use  . Smoking status: Current Every Day Smoker    Packs/day: 0.25  . Smokeless tobacco: Never Used  Substance Use Topics  . Alcohol use: Yes    Comment: socially  . Drug use: Yes    Types: Marijuana     Allergies   Patient has no known allergies.   Review of Systems Review of Systems  Constitutional: Positive for fever. Negative for activity change, appetite change, chills, diaphoresis, fatigue and unexpected weight change.  HENT: Positive for congestion. Negative for ear discharge, ear pain, facial swelling, hearing loss, mouth sores, nosebleeds, postnasal drip, rhinorrhea, sinus pressure, sinus pain, sneezing, sore throat, tinnitus, trouble swallowing and voice change.   Respiratory: Positive for cough. Negative for choking, chest tightness, shortness of breath, wheezing and stridor.   Cardiovascular: Negative.   Gastrointestinal: Negative.   Genitourinary: Negative.   Skin: Negative.   Neurological: Negative.   All other systems reviewed and are negative.    Physical Exam Updated Vital Signs BP 131/77 (BP Location: Right Arm)   Pulse 88   Temp 100.2 F (37.9 C) (Oral)   Ht 5\' 9"  (1.753 m)   Wt 77.1 kg   BMI 25.10 kg/m   Physical Exam Vitals signs and nursing note reviewed.  Constitutional:      General: He is not in acute distress.    Appearance: He is well-developed. He is not ill-appearing, toxic-appearing or  diaphoretic.     Comments: Able to speak in full sentences without difficulty.  No signs of acute respiratory distress.  HENT:     Head: Normocephalic and atraumatic.     Nose: Nose normal.     Comments: Nose with clear rhinorrhea.    Mouth/Throat:     Mouth: Mucous membranes are moist.     Comments: Posterior oropharynx without erythema or exudate.  Tonsils without edema or exudate.  Uvula midline without edema.  No drooling, dysphasia trismus. Eyes:     Pupils:  Pupils are equal, round, and reactive to light.  Neck:     Musculoskeletal: Normal range of motion and neck supple.  Cardiovascular:     Rate and Rhythm: Normal rate and regular rhythm.  Pulmonary:     Effort: Pulmonary effort is normal. No respiratory distress.     Comments: Clear to auscultation bilaterally without rales, rhonchi or wheeze.  No accessory muscle usage.  Able to speak in full sentences without difficulty.  Does not appear in any acute respiratory distress. Abdominal:     General: There is no distension.     Palpations: Abdomen is soft.     Comments: Soft nontender without rebound or guarding.  Musculoskeletal: Normal range of motion.     Comments: Moves all extremities without difficulty.  Skin:    General: Skin is warm and dry.  Neurological:     Mental Status: He is alert.      ED Treatments / Results  Labs (all labs ordered are listed, but only abnormal results are displayed) Labs Reviewed  INFLUENZA PANEL BY PCR (TYPE A & B)    EKG None  Radiology Dg Chest 2 View  Result Date: 08/20/2018 CLINICAL DATA:  Cough and fever EXAM: CHEST - 2 VIEW COMPARISON:  July 15, 2014 FINDINGS: No edema or consolidation. The heart size and pulmonary vascularity are normal. No adenopathy. No bone lesions. IMPRESSION: No edema or consolidation. Electronically Signed   By: Bretta Bang III M.D.   On: 08/20/2018 20:48    Procedures Procedures (including critical care time)  Medications Ordered in ED Medications - No data to display   Initial Impression / Assessment and Plan / ED Course  I have reviewed the triage vital signs and the nursing notes.  Pertinent labs & imaging results that were available during my care of the patient were reviewed by me and considered in my medical decision making (see chart for details).  27 year old male who appears otherwise well presents for evaluation of cough and fever.  Patient was symptom onset approximately 3-4 days ago.   Multiple sick contacts.  Has not received influenza vaccine.  Patient appears overall well.  Able to speak in full sentences without difficulty.  Lungs clear to auscultation bilaterally.  Moves all extremities without difficulty.  Patient requesting influenza screen and chest x-ray at this time.  Discussed with patient that he is outside treatment window for Tamiflu, patient states he would still like to "no."  X-ray negative for infiltrates, CHF, pulmonary edema or pneumothorax.  We have swabed for influenza, however I was notified by laboratory staffing that we do not have the carriages and this will need to be sent out.  Discussed this with patient.  He would like to be DC home.  Discussed symptomatic management.  Discussed follow-up with PCP for reevaluation.  Patient is hemodynamically stable and appropriate for DC home at this time.  Low suspicion for emergent pathology causing patient's symptoms at this  time.  Patient to DC home.  Patient voiced understanding of return precautions.     Final Clinical Impressions(s) / ED Diagnoses   Final diagnoses:  Influenza-like illness    ED Discharge Orders    None       Henderly, Britni A, PA-C 08/20/18 2157    Tegeler, Canary Brimhristopher J, MD 08/20/18 [phone]

## 2018-08-20 NOTE — ED Notes (Signed)
Pt. returned from XR. 

## 2018-11-05 ENCOUNTER — Ambulatory Visit (HOSPITAL_COMMUNITY)
Admission: EM | Admit: 2018-11-05 | Discharge: 2018-11-05 | Disposition: A | Discharge status: Home / Self Care | Payer: BLUE CROSS/BLUE SHIELD | Attending: Family Medicine | Admitting: Family Medicine

## 2018-11-05 ENCOUNTER — Other Ambulatory Visit: Payer: Self-pay

## 2018-11-05 ENCOUNTER — Encounter (HOSPITAL_COMMUNITY): Payer: Self-pay | Admitting: Emergency Medicine

## 2018-11-05 DIAGNOSIS — J029 Acute pharyngitis, unspecified: Secondary | ICD-10-CM

## 2018-11-05 MED ORDER — CHLORHEXIDINE GLUCONATE 0.12 % MT SOLN: 15.0000 mL | Freq: Two times a day (BID) | OROMUCOSAL | 0 refills | Status: DC

## 2018-11-05 NOTE — ED Triage Notes (Signed)
Pt complains of cough, sore throat and some nasal congestion that started yesterday.  He denies any fever.  He has been taking Nyquil for his symptoms.

## 2018-11-05 NOTE — ED Provider Notes (Signed)
[facility]    CSN: [id number] Arrival date & time: 11/05/18  1821     History   Chief Complaint Chief Complaint  Patient presents with   URI    HPI Kenneth Chang is a 28 y.o. male.   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  Onset of sore throat, congestion, and productive cough yesterday without fever, chills, or N/V/D     History reviewed. No pertinent past medical history.  There are no active problems to display for this patient.   Past Surgical History:  Procedure Laterality Date   HERNIA REPAIR         Home Medications    Prior to Admission medications   Medication Sig Start Date End Date Taking? Authorizing Provider  chlorhexidine (PERIDEX) 0.12 % solution Use as directed 15 mLs in the mouth or throat 2 (two) times daily. 11/05/18   Elvina Sidle, MD    Family History History reviewed. No pertinent family history.  Social History Social History   Tobacco Use   Smoking status: Current Every Day Smoker    Packs/day: 0.25   Smokeless tobacco: Never Used  Substance Use Topics   Alcohol use: Yes    Comment: socially   Drug use: Yes    Types: Marijuana     Allergies   Patient has no known allergies.   Review of Systems Review of Systems  Constitutional: Negative for chills, fatigue and fever.  HENT: Positive for sore throat.   Respiratory: Positive for cough.   Gastrointestinal: Negative for abdominal pain, diarrhea, nausea and vomiting.     Physical Exam Triage Vital Signs ED Triage Vitals [11/05/18 1910]  Enc Vitals Group     BP 126/71     Pulse Rate 67     Resp      Temp 98.4 F (36.9 C)     Temp Source Temporal     SpO2 100 %     Weight      Height      Head Circumference      Peak Flow      Pain Score 3     Pain Loc      Pain Edu?      Excl. in GC?    No data found.  Updated Vital Signs BP 126/71 (BP Location: Right Arm)    Pulse 67    Temp 98.4 F (36.9 C) (Temporal)     SpO2 100%    Physical Exam Vitals signs and nursing note reviewed.  Constitutional:      Appearance: Normal appearance. He is not ill-appearing.  HENT:     Right Ear: Tympanic membrane normal.     Left Ear: There is impacted cerumen.     Nose: Nose normal.     Mouth/Throat:     Mouth: Mucous membranes are moist.     Pharynx: No oropharyngeal exudate or posterior oropharyngeal erythema.  Eyes:     Conjunctiva/sclera: Conjunctivae normal.  Neck:     Musculoskeletal: Neck supple.  Cardiovascular:     Rate and Rhythm: Normal rate and regular rhythm.  Pulmonary:     Effort: Pulmonary effort is normal.     Breath sounds: Normal breath sounds.  Neurological:     General: No focal deficit present.     Mental Status: He is alert and oriented to person, place, and time.      UC Treatments / Results  Labs (all labs ordered are listed, but only  abnormal results are displayed) Labs Reviewed - No data to display  EKG None  Radiology No results found.  Procedures Procedures (including critical care time)  Medications Ordered in UC Medications - No data to display  Initial Impression / Assessment and Plan / UC Course  I have reviewed the triage vital signs and the nursing notes.  Pertinent labs & imaging results that were available during my care of the patient were reviewed by me and considered in my medical decision making (see chart for details).    Final Clinical Impressions(s) / UC Diagnoses   Final diagnoses:  Acute pharyngitis, unspecified etiology   Discharge Instructions   None    ED Prescriptions    Medication Sig Dispense Auth. Provider   chlorhexidine (PERIDEX) 0.12 % solution Use as directed 15 mLs in the mouth or throat 2 (two) times daily. 120 mL Elvina Sidle, MD     Controlled Substance Prescriptions [place] Controlled Substance Registry consulted? Not Applicable   Elvina Sidle, MD 11/05/18 [phone]

## 2019-11-27 IMAGING — CR DG CHEST 2V
2 series · 2 of 2 positions shown · non-contrast
Comparison: July 15, 2014

CLINICAL DATA: Cough and fever

EXAM:
CHEST - 2 VIEW

[chest pa]
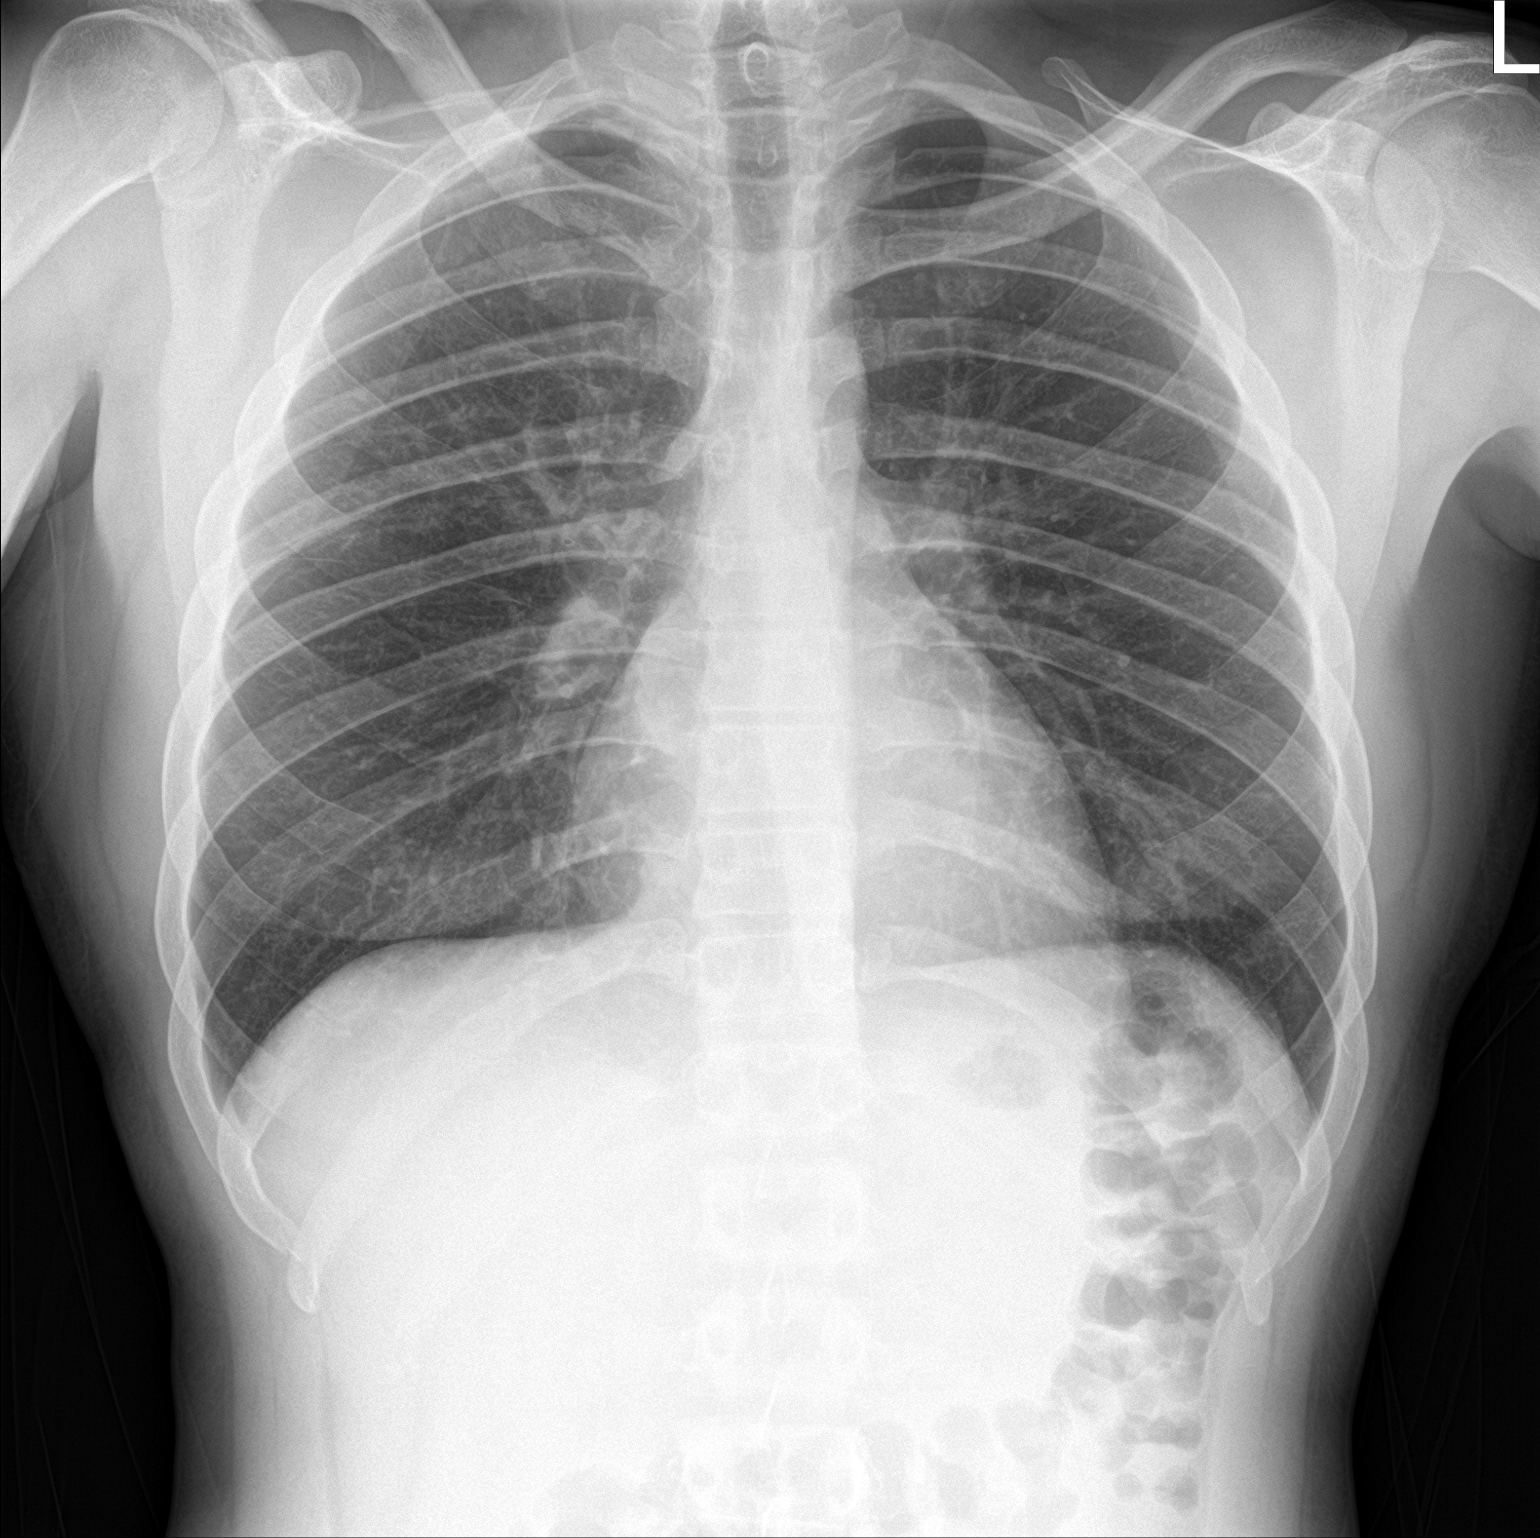

[chest lat]
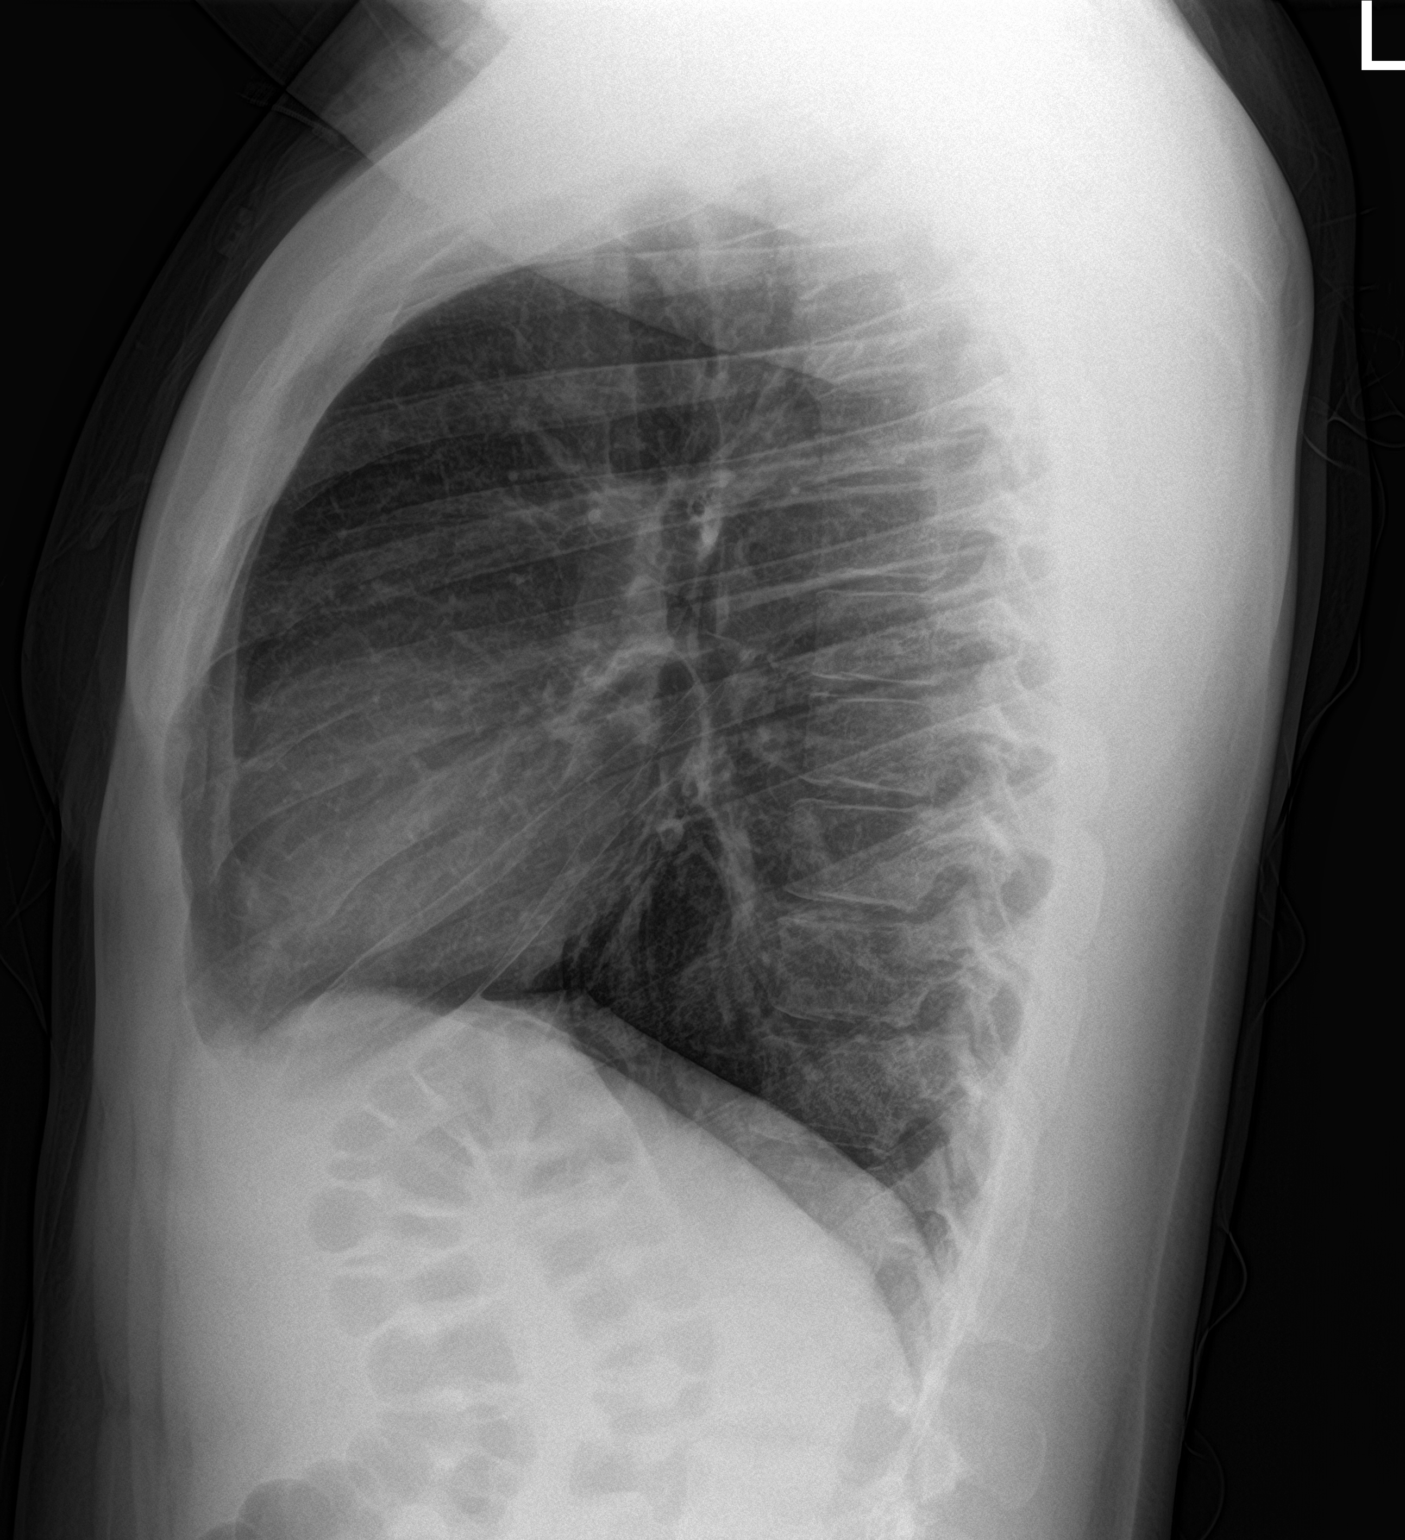

[2 of 2 positions shown; findings below may reference images not displayed]

FINDINGS: No edema or consolidation. The heart size and pulmonary vascularity
are normal. No adenopathy. No bone lesions.
IMPRESSION: No edema or consolidation.

## 2020-03-14 ENCOUNTER — Ambulatory Visit: Payer: Self-pay

## 2020-03-15 ENCOUNTER — Other Ambulatory Visit: Payer: Self-pay

## 2020-03-15 ENCOUNTER — Ambulatory Visit (HOSPITAL_COMMUNITY): Payer: Self-pay

## 2020-03-15 ENCOUNTER — Ambulatory Visit (HOSPITAL_COMMUNITY)
Admission: EM | Admit: 2020-03-15 | Discharge: 2020-03-15 | Disposition: A | Discharge status: Home / Self Care | Payer: Self-pay

## 2020-03-15 ENCOUNTER — Encounter (HOSPITAL_COMMUNITY): Payer: Self-pay

## 2020-03-15 ENCOUNTER — Ambulatory Visit (HOSPITAL_COMMUNITY)
Admission: EM | Admit: 2020-03-15 | Discharge: 2020-03-15 | Disposition: A | Discharge status: Home / Self Care | Payer: 59 | Attending: Family Medicine | Admitting: Family Medicine

## 2020-03-15 DIAGNOSIS — Z113 Encounter for screening for infections with a predominantly sexual mode of transmission: Secondary | ICD-10-CM | POA: Diagnosis not present

## 2020-03-15 DIAGNOSIS — Z20822 Contact with and (suspected) exposure to covid-19: Secondary | ICD-10-CM | POA: Diagnosis present

## 2020-03-15 LAB — HIV ANTIBODY (ROUTINE TESTING W REFLEX): HIV Screen 4th Generation wRfx: NONREACTIVE

## 2020-03-15 NOTE — Discharge Instructions (Signed)
  You have been tested for COVID-19 today. If your test returns positive, you will receive a phone call from [facility] regarding your results. Negative test results are not called. Both positive and negative results area always visible on MyChart. If you do not have a MyChart account, sign up instructions are provided in your discharge papers. Please do not hesitate to contact us should you have questions or concerns.  We have sent testing for sexually transmitted infections. We will notify you of any positive results once they are received. If required, we will prescribe any medications you might need.  Please refrain from all sexual activity for at least the next seven days.

## 2020-03-15 NOTE — ED Provider Notes (Signed)
[facility]   [id number] 03/15/20 Arrival Time: 1103  ASSESSMENT & PLAN:  1. Screening for STDs (sexually transmitted diseases)   2. Encounter for screening laboratory testing for COVID-19 virus in asymptomatic patient       Discharge Instructions      You have been tested for COVID-19 today. If your test returns positive, you will receive a phone call from [facility] regarding your results. Negative test results are not called. Both positive and negative results area always visible on MyChart. If you do not have a MyChart account, sign up instructions are provided in your discharge papers. Please do not hesitate to contact us should you have questions or concerns.  We have sent testing for sexually transmitted infections. We will notify you of any positive results once they are received. If required, we will prescribe any medications you might need.  Please refrain from all sexual activity for at least the next seven days.     Pending: Labs Reviewed  SARS CORONAVIRUS 2 (TAT 6-24 HRS)  RPR  HIV ANTIBODY (ROUTINE TESTING W REFLEX)  CYTOLOGY, (ORAL, ANAL, URETHRAL) ANCILLARY ONLY    Will notify of any positive results. Instructed to refrain from sexual activity for at least seven days.  Reviewed expectations re: course of current medical issues. Questions answered. Outlined signs and symptoms indicating need for more acute intervention. Patient verbalized understanding. After Visit Summary given.   SUBJECTIVE:  Kenneth Chang is a 29 y.o. male who requests STI screening. No symptoms. No h/o STI reported.  Also requests COVID testing. Traveling to see friend soon. No symptoms.  OBJECTIVE:  Vitals:   03/15/20 1130 03/15/20 1131  BP: 137/85   Pulse: 73   Resp: 16   Temp: 98.6 F (37 C)   TempSrc: Oral   SpO2: 98%   Weight:  86.2 kg  Height:  5' 9.75" (1.772 m)     General appearance: alert, cooperative, appears stated age and no  distress Lungs: unlabored respirations; speaks full sentences without difficulty GU: normal appearing genitalia Skin: warm and dry Psychological: alert and cooperative; normal mood and affect.    Labs Reviewed  SARS CORONAVIRUS 2 (TAT 6-24 HRS)  RPR  HIV ANTIBODY (ROUTINE TESTING W REFLEX)  CYTOLOGY, (ORAL, ANAL, URETHRAL) ANCILLARY ONLY    No Known Allergies  History reviewed. No pertinent past medical history. No family history on file. Social History   Socioeconomic History  . Marital status: Single    Spouse name: Not on file  . Number of children: Not on file  . Years of education: Not on file  . Highest education level: Not on file  Occupational History  . Not on file  Tobacco Use  . Smoking status: Current Every Day Smoker    Packs/day: 0.25    Types: Cigarettes  . Smokeless tobacco: Never Used  Substance and Sexual Activity  . Alcohol use: Yes    Comment: socially  . Drug use: Yes    Types: Marijuana  . Sexual activity: Not on file  Other Topics Concern  . Not on file  Social History Narrative  . Not on file   Social Determinants of Health   Financial Resource Strain:   . Difficulty of Paying Living Expenses:   Food Insecurity:   . Worried About Programme researcher, broadcasting/film/video in the Last Year:   . Barista in the Last Year:   Transportation Needs:   . Freight forwarder (Medical):   Marland Kitchen Lack  of Transportation (Non-Medical):   Physical Activity:   . Days of Exercise per Week:   . Minutes of Exercise per Session:   Stress:   . Feeling of Stress :   Social Connections:   . Frequency of Communication with Friends and Family:   . Frequency of Social Gatherings with Friends and Family:   . Attends Religious Services:   . Active Member of Clubs or Organizations:   . Attends Banker Meetings:   Marland Kitchen Marital Status:   Intimate Partner Violence:   . Fear of Current or Ex-Partner:   . Emotionally Abused:   Marland Kitchen Physically Abused:   . Sexually  Abused:           Mardella Layman, MD 03/15/20 1140

## 2020-03-15 NOTE — ED Triage Notes (Signed)
Pt wants STi testing. Pt denies symptoms.

## 2020-03-16 LAB — CYTOLOGY, (ORAL, ANAL, URETHRAL) ANCILLARY ONLY
Chlamydia: NEGATIVE
Comment: NEGATIVE
Comment: NEGATIVE
Comment: NORMAL
Neisseria Gonorrhea: NEGATIVE
Trichomonas: NEGATIVE

## 2020-03-16 LAB — SARS CORONAVIRUS 2 (TAT 6-24 HRS): SARS Coronavirus 2: NEGATIVE

## 2020-03-16 LAB — RPR: RPR Ser Ql: NONREACTIVE

## 2021-12-29 ENCOUNTER — Emergency Department (HOSPITAL_COMMUNITY): Payer: 59

## 2021-12-29 ENCOUNTER — Emergency Department (HOSPITAL_COMMUNITY)
Admission: EM | Admit: 2021-12-29 | Discharge: 2021-12-29 | Disposition: A | Discharge status: Home / Self Care | Payer: 59 | Attending: Emergency Medicine | Admitting: Emergency Medicine

## 2021-12-29 ENCOUNTER — Other Ambulatory Visit: Payer: Self-pay

## 2021-12-29 ENCOUNTER — Encounter (HOSPITAL_COMMUNITY): Payer: Self-pay

## 2021-12-29 DIAGNOSIS — R519 Headache, unspecified: Secondary | ICD-10-CM | POA: Diagnosis not present

## 2021-12-29 DIAGNOSIS — S8991XA Unspecified injury of right lower leg, initial encounter: Secondary | ICD-10-CM | POA: Insufficient documentation

## 2021-12-29 DIAGNOSIS — S99912A Unspecified injury of left ankle, initial encounter: Secondary | ICD-10-CM | POA: Insufficient documentation

## 2021-12-29 DIAGNOSIS — S6992XA Unspecified injury of left wrist, hand and finger(s), initial encounter: Secondary | ICD-10-CM

## 2021-12-29 DIAGNOSIS — S199XXA Unspecified injury of neck, initial encounter: Secondary | ICD-10-CM | POA: Diagnosis not present

## 2021-12-29 DIAGNOSIS — S8992XA Unspecified injury of left lower leg, initial encounter: Secondary | ICD-10-CM | POA: Diagnosis not present

## 2021-12-29 MED ORDER — CYCLOBENZAPRINE HCL 10 MG PO TABS: 10.0000 mg | ORAL_TABLET | Freq: Two times a day (BID) | ORAL | 0 refills | Status: DC | PRN
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
## 2021-12-29 NOTE — Discharge Instructions (Signed)
I placed you in a splint of your left middle finger, please leave on you may take off to shower.  Recommend over-the-counter pain medication as needed.  Continue Flexeril please take as prescribed. ? ?It is important they follow-up with orthopedic surgery given you the information above please call Monday morning for a follow-up. ? ?Come back to the emergency department if you develop chest pain, shortness of breath, severe abdominal pain, uncontrolled nausea, vomiting, diarrhea. ? ?
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
## 2021-12-29 NOTE — ED Notes (Signed)
Pt to xray via stretcher

## 2021-12-29 NOTE — ED Notes (Signed)
EDPA into room, at BS.  

## 2021-12-29 NOTE — ED Notes (Signed)
ED PA at BS 

## 2021-12-31 ENCOUNTER — Telehealth: Payer: Self-pay | Admitting: Orthopedic Surgery
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
## 2022-01-01 ENCOUNTER — Encounter: Payer: Self-pay | Admitting: Orthopedic Surgery

## 2022-01-01 ENCOUNTER — Ambulatory Visit: Payer: 59 | Admitting: Orthopedic Surgery

## 2022-01-01 DIAGNOSIS — M20022 Boutonniere deformity of left finger(s): Secondary | ICD-10-CM | POA: Diagnosis not present
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
## 2022-01-01 NOTE — Telephone Encounter (Signed)
Patient has already been scheduled for 1pm today.  ?

## 2022-01-02 ENCOUNTER — Encounter: Payer: Self-pay | Admitting: Rehabilitative and Restorative Service Providers"

## 2022-01-02 ENCOUNTER — Other Ambulatory Visit: Payer: Self-pay

## 2022-01-02 ENCOUNTER — Ambulatory Visit (INDEPENDENT_AMBULATORY_CARE_PROVIDER_SITE_OTHER): Payer: 59 | Admitting: Rehabilitative and Restorative Service Providers"

## 2022-01-02 DIAGNOSIS — R600 Localized edema: Secondary | ICD-10-CM

## 2022-01-02 DIAGNOSIS — M25642 Stiffness of left hand, not elsewhere classified: Secondary | ICD-10-CM

## 2022-01-02 DIAGNOSIS — R278 Other lack of coordination: Secondary | ICD-10-CM | POA: Diagnosis not present

## 2022-01-02 DIAGNOSIS — M25542 Pain in joints of left hand: Secondary | ICD-10-CM

## 2022-01-02 DIAGNOSIS — M6281 Muscle weakness (generalized): Secondary | ICD-10-CM
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
## 2022-01-29 ENCOUNTER — Encounter: Payer: 59 | Admitting: Rehabilitative and Restorative Service Providers"

## 2022-01-30 ENCOUNTER — Ambulatory Visit (INDEPENDENT_AMBULATORY_CARE_PROVIDER_SITE_OTHER): Payer: 59 | Admitting: Rehabilitative and Restorative Service Providers"

## 2022-01-30 ENCOUNTER — Encounter: Payer: Self-pay | Admitting: Rehabilitative and Restorative Service Providers"

## 2022-01-30 DIAGNOSIS — M25542 Pain in joints of left hand: Secondary | ICD-10-CM

## 2022-01-30 DIAGNOSIS — M25642 Stiffness of left hand, not elsewhere classified: Secondary | ICD-10-CM

## 2022-01-30 DIAGNOSIS — R278 Other lack of coordination: Secondary | ICD-10-CM

## 2022-01-30 DIAGNOSIS — R600 Localized edema: Secondary | ICD-10-CM

## 2022-01-30 DIAGNOSIS — M6281 Muscle weakness (generalized): Secondary | ICD-10-CM
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
## 2022-02-07 ENCOUNTER — Encounter: Payer: Self-pay | Admitting: Rehabilitative and Restorative Service Providers"

## 2022-02-07 ENCOUNTER — Ambulatory Visit (INDEPENDENT_AMBULATORY_CARE_PROVIDER_SITE_OTHER): Payer: 59 | Admitting: Rehabilitative and Restorative Service Providers"

## 2022-02-07 DIAGNOSIS — M6281 Muscle weakness (generalized): Secondary | ICD-10-CM | POA: Diagnosis not present

## 2022-02-07 DIAGNOSIS — R278 Other lack of coordination: Secondary | ICD-10-CM | POA: Diagnosis not present

## 2022-02-07 DIAGNOSIS — R600 Localized edema: Secondary | ICD-10-CM

## 2022-02-07 DIAGNOSIS — M25542 Pain in joints of left hand: Secondary | ICD-10-CM

## 2022-02-07 DIAGNOSIS — M25642 Stiffness of left hand, not elsewhere classified: Secondary | ICD-10-CM
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
## 2022-02-15 ENCOUNTER — Ambulatory Visit (INDEPENDENT_AMBULATORY_CARE_PROVIDER_SITE_OTHER): Payer: 59 | Admitting: Orthopedic Surgery

## 2022-02-15 DIAGNOSIS — M20022 Boutonniere deformity of left finger(s): Secondary | ICD-10-CM | POA: Diagnosis not present

## 2022-02-15 NOTE — Progress Notes (Signed)
   Office Visit Note   Patient: Kenneth Chang           Date of Birth: 1990/09/12           MRN: [id number] Visit Date: 02/15/2022              Requested by: No referring provider defined for this encounter. PCP: Patient, [name]   Assessment & Plan: Visit Diagnoses:  1. Central slip extensor tendon injury (boutonniere), left     Plan: Patient is now 6 weeks out from splint immobilization for his left middle finger boutonniere injury.  He has some stiffness at the PIP joint but his extensor lag is much improved.  He has been working hard with therapy.  He will continue to work with therapy for immobilization and range of motion exercises.  I can see him back in another several weeks.  Follow-Up Instructions: No follow-ups on file.   Orders:  No orders of the defined types were placed in this encounter.  No orders of the defined types were placed in this encounter.     Procedures: No procedures performed   Clinical Data: No additional findings.   Subjective: Chief Complaint  Patient presents with   Left Middle Finger - Follow-up    This is a 31 year old right-hand-dominant male who presents presents for follow-up of a left middle finger boutonniere deformity.  He has been in an extension splint at the PIP joint for the last 6 weeks with the DIP joint free.  He been working hard with therapy.  The pain of his middle finger at the PIP joint much improved.  His extensor lag has improved.  He is overall happy with his result so far.    Review of Systems   Objective: Vital Signs: There were no vitals taken for this visit.  Physical Exam  Left Hand Exam   Tenderness  The patient is experiencing no tenderness.   Other  Erythema: absent Sensation: normal Pulse: present  Comments:  Approximately 20 deg extensor lag at PIP joint.       Specialty Comments:  No specialty comments available.  Imaging: No results found.   PMFS History: Patient Active  Problem List   Diagnosis Date Noted   Central slip extensor tendon injury (boutonniere), left 01/01/2022   No past medical history on file.  No family history on file.  Past Surgical History:  Procedure Laterality Date   HERNIA REPAIR     Social History   Occupational History   Not on file  Tobacco Use   Smoking status: Every Day    Packs/day: 0.25    Types: Cigars, Cigarettes   Smokeless tobacco: Never  Substance and Sexual Activity   Alcohol use: Yes    Alcohol/week: 2.0 standard drinks of alcohol    Types: 1 Cans of beer, 1 Shots of liquor per week    Comment: socially   Drug use: Yes    Types: Marijuana   Sexual activity: Yes

## 2022-02-27 ENCOUNTER — Encounter: Payer: 59 | Admitting: Rehabilitative and Restorative Service Providers"

## 2022-03-04 ENCOUNTER — Ambulatory Visit (INDEPENDENT_AMBULATORY_CARE_PROVIDER_SITE_OTHER): Payer: 59 | Admitting: Rehabilitative and Restorative Service Providers"

## 2022-03-04 ENCOUNTER — Encounter: Payer: Self-pay | Admitting: Rehabilitative and Restorative Service Providers"

## 2022-03-04 DIAGNOSIS — M25642 Stiffness of left hand, not elsewhere classified: Secondary | ICD-10-CM

## 2022-03-04 DIAGNOSIS — M25542 Pain in joints of left hand: Secondary | ICD-10-CM

## 2022-03-04 DIAGNOSIS — R600 Localized edema: Secondary | ICD-10-CM

## 2022-03-04 DIAGNOSIS — M6281 Muscle weakness (generalized): Secondary | ICD-10-CM | POA: Diagnosis not present

## 2022-03-04 DIAGNOSIS — R278 Other lack of coordination: Secondary | ICD-10-CM | POA: Diagnosis not present
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
## 2022-03-19 ENCOUNTER — Encounter: Payer: 59 | Admitting: Occupational Therapy

## 2022-04-01 ENCOUNTER — Ambulatory Visit (INDEPENDENT_AMBULATORY_CARE_PROVIDER_SITE_OTHER): Payer: 59 | Admitting: Rehabilitative and Restorative Service Providers"

## 2022-04-01 ENCOUNTER — Encounter: Payer: Self-pay | Admitting: Rehabilitative and Restorative Service Providers"

## 2022-04-01 DIAGNOSIS — M25542 Pain in joints of left hand: Secondary | ICD-10-CM

## 2022-04-01 DIAGNOSIS — M6281 Muscle weakness (generalized): Secondary | ICD-10-CM

## 2022-04-01 DIAGNOSIS — R278 Other lack of coordination: Secondary | ICD-10-CM

## 2022-04-01 DIAGNOSIS — R600 Localized edema: Secondary | ICD-10-CM | POA: Diagnosis not present

## 2022-04-01 DIAGNOSIS — M25642 Stiffness of left hand, not elsewhere classified: Secondary | ICD-10-CM
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
## 2022-04-15 ENCOUNTER — Encounter: Payer: Self-pay | Admitting: Rehabilitative and Restorative Service Providers"

## 2022-04-15 ENCOUNTER — Ambulatory Visit (INDEPENDENT_AMBULATORY_CARE_PROVIDER_SITE_OTHER): Payer: 59 | Admitting: Rehabilitative and Restorative Service Providers"

## 2022-04-15 DIAGNOSIS — M25642 Stiffness of left hand, not elsewhere classified: Secondary | ICD-10-CM

## 2022-04-15 DIAGNOSIS — M25542 Pain in joints of left hand: Secondary | ICD-10-CM | POA: Diagnosis not present

## 2022-04-15 DIAGNOSIS — M6281 Muscle weakness (generalized): Secondary | ICD-10-CM

## 2022-04-15 DIAGNOSIS — R278 Other lack of coordination: Secondary | ICD-10-CM

## 2022-04-15 DIAGNOSIS — R600 Localized edema: Secondary | ICD-10-CM | POA: Diagnosis not present
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
## 2022-06-28 ENCOUNTER — Ambulatory Visit: Payer: 59

## 2022-11-01 ENCOUNTER — Emergency Department (HOSPITAL_COMMUNITY)
Admission: EM | Admit: 2022-11-01 | Discharge: 2022-11-01 | Disposition: A | Discharge status: Home / Self Care | Payer: 59 | Attending: Emergency Medicine | Admitting: Emergency Medicine

## 2022-11-01 ENCOUNTER — Other Ambulatory Visit: Payer: Self-pay

## 2022-11-01 ENCOUNTER — Encounter (HOSPITAL_COMMUNITY): Payer: Self-pay

## 2022-11-01 DIAGNOSIS — R112 Nausea with vomiting, unspecified: Secondary | ICD-10-CM

## 2022-11-01 DIAGNOSIS — R7401 Elevation of levels of liver transaminase levels: Secondary | ICD-10-CM | POA: Diagnosis not present

## 2022-11-01 DIAGNOSIS — K2900 Acute gastritis without bleeding: Secondary | ICD-10-CM | POA: Diagnosis not present

## 2022-11-01 DIAGNOSIS — D72829 Elevated white blood cell count, unspecified: Secondary | ICD-10-CM | POA: Diagnosis not present

## 2022-11-01 DIAGNOSIS — R7989 Other specified abnormal findings of blood chemistry: Secondary | ICD-10-CM | POA: Insufficient documentation

## 2022-11-01 LAB — CBC WITH DIFFERENTIAL/PLATELET
Abs Immature Granulocytes: 0.03 10*3/uL (ref 0.00–0.07)
Basophils Absolute: 0 10*3/uL (ref 0.0–0.1)
Basophils Relative: 0 %
Eosinophils Absolute: 0 10*3/uL (ref 0.0–0.5)
Eosinophils Relative: 0 %
HCT: 43.7 % (ref 39.0–52.0)
Hemoglobin: 14.8 g/dL (ref 13.0–17.0)
Immature Granulocytes: 0 %
Lymphocytes Relative: 19 %
Lymphs Abs: 2.2 10*3/uL (ref 0.7–4.0)
MCH: 27.5 pg (ref 26.0–34.0)
MCHC: 33.9 g/dL (ref 30.0–36.0)
MCV: 81.2 fL (ref 80.0–100.0)
Monocytes Absolute: 1.2 10*3/uL — ABNORMAL HIGH (ref 0.1–1.0)
Monocytes Relative: 11 %
Neutro Abs: 8 10*3/uL — ABNORMAL HIGH (ref 1.7–7.7)
Neutrophils Relative %: 70 %
Platelets: 250 10*3/uL (ref 150–400)
RBC: 5.38 MIL/uL (ref 4.22–5.81)
RDW: 14.1 % (ref 11.5–15.5)
WBC: 11.5 10*3/uL — ABNORMAL HIGH (ref 4.0–10.5)
nRBC: 0 % (ref 0.0–0.2)

## 2022-11-01 LAB — COMPREHENSIVE METABOLIC PANEL
ALT: 5 U/L (ref 0–44)
AST: 74 U/L — ABNORMAL HIGH (ref 15–41)
Albumin: 4.7 g/dL (ref 3.5–5.0)
Alkaline Phosphatase: 46 U/L (ref 38–126)
Anion gap: 16 — ABNORMAL HIGH (ref 5–15)
BUN: 24 mg/dL — ABNORMAL HIGH (ref 6–20)
CO2: 20 mmol/L — ABNORMAL LOW (ref 22–32)
Calcium: 9.6 mg/dL (ref 8.9–10.3)
Chloride: 102 mmol/L (ref 98–111)
Creatinine, Ser: 1.53 mg/dL — ABNORMAL HIGH (ref 0.61–1.24)
GFR, Estimated: >60 mL/min (ref >60)
Glucose, Bld: 115 mg/dL — ABNORMAL HIGH (ref 70–99)
Potassium: 3.6 mmol/L (ref 3.5–5.1)
Sodium: 138 mmol/L (ref 135–145)
Total Bilirubin: 1.4 mg/dL — ABNORMAL HIGH (ref 0.3–1.2)
Total Protein: 8 g/dL (ref 6.5–8.1)

## 2022-11-01 LAB — LIPASE, BLOOD: Lipase: 45 U/L (ref 11–51)

## 2022-11-01 MED ORDER — HALOPERIDOL LACTATE 5 MG/ML IJ SOLN
2.5000 mg | Freq: Once | INTRAMUSCULAR | Status: AC
  Administered 2022-11-01: 2.5 mg via INTRAVENOUS
  Filled 2022-11-01: qty 1

## 2022-11-01 MED ORDER — ONDANSETRON 4 MG PO TBDP: 4.0000 mg | ORAL_TABLET | Freq: Three times a day (TID) | ORAL | 0 refills | Status: DC | PRN

## 2022-11-01 MED ORDER — PANTOPRAZOLE SODIUM 40 MG PO TBEC: 40.0000 mg | DELAYED_RELEASE_TABLET | Freq: Every day | ORAL | 0 refills | Status: DC

## 2022-11-01 MED ORDER — FAMOTIDINE IN NACL 20-0.9 MG/50ML-% IV SOLN
20.0000 mg | Freq: Once | INTRAVENOUS | Status: AC
  Administered 2022-11-01: 20 mg via INTRAVENOUS
  Filled 2022-11-01: qty 50

## 2022-11-01 MED ORDER — SODIUM CHLORIDE 0.9 % IV BOLUS
1000.0000 mL | Freq: Once | INTRAVENOUS | Status: AC
  Administered 2022-11-01: 1000 mL via INTRAVENOUS

## 2022-11-01 NOTE — ED Triage Notes (Signed)
Pt reports "extreme nausea" with "continuous belching" since Saturday.  Pt reports movement and hot showers make it better, Pt is running in place in triage.

## 2022-11-01 NOTE — Discharge Instructions (Addendum)
You are seen in the emergency department for nausea vomiting and possibly some bleeding.  Your lab work showed you to be slightly dehydrated.  Please drink plenty of fluids and rest.  We are starting you on some nausea and acid medication.  Contact GI for outpatient evaluation.  This may related to marijuana use so please consider stopping marijuana

## 2022-11-01 NOTE — ED Provider Notes (Signed)
[facility] Provider Note   CSN: [id number] Arrival date & time: 11/01/22  1925     History {Add pertinent medical, surgical, social history, OB history to HPI:1} Chief Complaint  Patient presents with   Nausea    Kenneth Chang is a 32 y.o. male.  Has no significant past medical history.  He is complaining of ongoing nausea that is been going on for a week.  It is caused him to vomit and has had a little bit of blood in the vomit.  He said he has had this before and he was diagnosed with cannabis hyperemesis, stopped marijuana for a while and it improved but has restarted marijuana again.  He said he has not smoked for 3 days.  No real abdominal pain.  He does say hot showers seem to help and if he exercises he also feels better.  No fevers or chills no diarrhea.  The history is provided by the patient.  Emesis Severity:  Moderate Duration:  1 week Timing:  Intermittent Quality:  Bilious material and bright red blood Progression:  Unchanged Chronicity:  Recurrent Recent urination:  Normal Relieved by:  Nothing Worsened by:  Nothing Ineffective treatments: hot showers. Associated symptoms: no abdominal pain, no diarrhea, no fever and no sore throat   Risk factors: no sick contacts and no travel to endemic areas        Home Medications Prior to Admission medications   Medication Sig Start Date End Date Taking? Authorizing Provider  cyclobenzaprine (FLEXERIL) 10 MG tablet Take 1 tablet (10 mg total) by mouth 2 (two) times daily as needed for muscle spasms. 12/29/21   Marcello Fennel, PA-C      Allergies    Patient has no known allergies.    Review of Systems   Review of Systems  Constitutional:  Negative for fever.  HENT:  Negative for sore throat.   Respiratory:  Negative for shortness of breath.   Cardiovascular:  Negative for chest pain.  Gastrointestinal:  Positive for nausea and vomiting. Negative for abdominal pain  and diarrhea.  Genitourinary:  Negative for dysuria.  Skin:  Negative for rash.    Physical Exam Updated Vital Signs BP (!) 172/79 (BP Location: Right Arm)   Pulse 61   Temp 98.1 F (36.7 C) (Oral)   Resp 18   Ht '5\' 10"'$  (1.778 m)   Wt 108.9 kg   SpO2 98%   BMI 34.44 kg/m  Physical Exam Vitals and nursing note reviewed.  Constitutional:      General: He is not in acute distress.    Appearance: Normal appearance. He is well-developed.  HENT:     Head: Normocephalic and atraumatic.  Eyes:     Conjunctiva/sclera: Conjunctivae normal.  Cardiovascular:     Rate and Rhythm: Normal rate and regular rhythm.     Heart sounds: No murmur heard. Pulmonary:     Effort: Pulmonary effort is normal. No respiratory distress.     Breath sounds: Normal breath sounds.  Abdominal:     Palpations: Abdomen is soft.     Tenderness: There is no abdominal tenderness. There is no guarding or rebound.  Musculoskeletal:        General: No deformity.     Cervical back: Neck supple.  Skin:    General: Skin is warm and dry.     Capillary Refill: Capillary refill takes less than 2 seconds.  Neurological:     General: No focal  deficit present.     Mental Status: He is alert.     ED Results / Procedures / Treatments   Labs (all labs ordered are listed, but only abnormal results are displayed) Labs Reviewed  COMPREHENSIVE METABOLIC PANEL  LIPASE, BLOOD  CBC WITH DIFFERENTIAL/PLATELET    EKG None  Radiology No results found.  Procedures Procedures  {Document cardiac monitor, telemetry assessment procedure when appropriate:1}  Medications Ordered in ED Medications  sodium chloride 0.9 % bolus 1,000 mL (has no administration in time range)  haloperidol lactate (HALDOL) injection 2.5 mg (has no administration in time range)  famotidine (PEPCID) IVPB 20 mg premix (has no administration in time range)    ED Course/ Medical Decision Making/ A&P   {   Click here for ABCD2, HEART and  other calculatorsREFRESH Note before signing :1}                          Medical Decision Making Amount and/or Complexity of Data Reviewed Labs: ordered.  Risk Prescription drug management.   This patient complains of ***; this involves an extensive number of treatment Options and is a complaint that carries with it a high risk of complications and morbidity. The differential includes ***  I ordered, reviewed and interpreted labs, which included *** I ordered medication *** and reviewed PMP when indicated. I ordered imaging studies which included *** and I independently    visualized and interpreted imaging which showed *** Additional history obtained from *** Previous records obtained and reviewed *** I consulted *** and discussed lab and imaging findings and discussed disposition.  Cardiac monitoring reviewed, *** Social determinants considered, *** Critical Interventions: ***  After the interventions stated above, I reevaluated the patient and found *** Admission and further testing considered, ***   {Document critical care time when appropriate:1} {Document review of labs and clinical decision tools ie heart score, Chads2Vasc2 etc:1}  {Document your independent review of radiology images, and any outside records:1} {Document your discussion with family members, caretakers, and with consultants:1} {Document social determinants of health affecting pt's care:1} {Document your decision making why or why not admission, treatments were needed:1} Final Clinical Impression(s) / ED Diagnoses Final diagnoses:  None    Rx / DC Orders ED Discharge Orders     None

## 2022-11-05 ENCOUNTER — Observation Stay (HOSPITAL_COMMUNITY)
Admission: EM | Admit: 2022-11-05 | Discharge: 2022-11-06 | Disposition: A | Discharge status: Home / Self Care | Payer: 59 | Attending: Family Medicine | Admitting: Family Medicine

## 2022-11-05 ENCOUNTER — Emergency Department (HOSPITAL_COMMUNITY): Payer: 59

## 2022-11-05 ENCOUNTER — Other Ambulatory Visit: Payer: Self-pay

## 2022-11-05 ENCOUNTER — Encounter (HOSPITAL_COMMUNITY): Payer: Self-pay | Admitting: Pharmacy Technician

## 2022-11-05 DIAGNOSIS — N179 Acute kidney failure, unspecified: Secondary | ICD-10-CM | POA: Diagnosis not present

## 2022-11-05 DIAGNOSIS — R112 Nausea with vomiting, unspecified: Secondary | ICD-10-CM | POA: Diagnosis present

## 2022-11-05 DIAGNOSIS — R1116 Cannabis hyperemesis syndrome: Secondary | ICD-10-CM

## 2022-11-05 DIAGNOSIS — E860 Dehydration: Secondary | ICD-10-CM | POA: Diagnosis not present

## 2022-11-05 DIAGNOSIS — Z79899 Other long term (current) drug therapy: Secondary | ICD-10-CM | POA: Insufficient documentation

## 2022-11-05 DIAGNOSIS — E871 Hypo-osmolality and hyponatremia: Secondary | ICD-10-CM | POA: Insufficient documentation

## 2022-11-05 DIAGNOSIS — F17290 Nicotine dependence, other tobacco product, uncomplicated: Secondary | ICD-10-CM | POA: Insufficient documentation

## 2022-11-05 DIAGNOSIS — F1290 Cannabis use, unspecified, uncomplicated: Secondary | ICD-10-CM

## 2022-11-05 LAB — COMPREHENSIVE METABOLIC PANEL
ALT: 35 U/L (ref 0–44)
AST: 26 U/L (ref 15–41)
Albumin: 3.3 g/dL — ABNORMAL LOW (ref 3.5–5.0)
Alkaline Phosphatase: 98 U/L (ref 38–126)
Anion gap: 11 (ref 5–15)
BUN: 50 mg/dL — ABNORMAL HIGH (ref 6–20)
CO2: 26 mmol/L (ref 22–32)
Calcium: 8.7 mg/dL — ABNORMAL LOW (ref 8.9–10.3)
Chloride: 97 mmol/L — ABNORMAL LOW (ref 98–111)
Creatinine, Ser: 2.45 mg/dL — ABNORMAL HIGH (ref 0.61–1.24)
GFR, Estimated: 35 mL/min — ABNORMAL LOW (ref >60)
Glucose, Bld: 132 mg/dL — ABNORMAL HIGH (ref 70–99)
Potassium: 4.4 mmol/L (ref 3.5–5.1)
Sodium: 134 mmol/L — ABNORMAL LOW (ref 135–145)
Total Bilirubin: 0.7 mg/dL (ref 0.3–1.2)
Total Protein: 7.1 g/dL (ref 6.5–8.1)

## 2022-11-05 LAB — URINALYSIS, ROUTINE W REFLEX MICROSCOPIC
Glucose, UA: NEGATIVE mg/dL
Ketones, ur: >80 mg/dL — AB
Leukocytes,Ua: NEGATIVE
Nitrite: NEGATIVE
Protein, ur: 100 mg/dL — AB
Specific Gravity, Urine: >1.03 — ABNORMAL HIGH (ref 1.005–1.030)
pH: 6 (ref 5.0–8.0)

## 2022-11-05 LAB — CBC
HCT: 44.1 % (ref 39.0–52.0)
Hemoglobin: 14.4 g/dL (ref 13.0–17.0)
MCH: 27.4 pg (ref 26.0–34.0)
MCHC: 32.7 g/dL (ref 30.0–36.0)
MCV: 84 fL (ref 80.0–100.0)
Platelets: 232 10*3/uL (ref 150–400)
RBC: 5.25 MIL/uL (ref 4.22–5.81)
RDW: 13.8 % (ref 11.5–15.5)
WBC: 8.4 10*3/uL (ref 4.0–10.5)
nRBC: 0 % (ref 0.0–0.2)

## 2022-11-05 LAB — URINALYSIS, MICROSCOPIC (REFLEX)

## 2022-11-05 LAB — RAPID URINE DRUG SCREEN, HOSP PERFORMED
Amphetamines: NOT DETECTED
Barbiturates: NOT DETECTED
Benzodiazepines: POSITIVE — AB
Cocaine: NOT DETECTED
Opiates: NOT DETECTED
Tetrahydrocannabinol: POSITIVE — AB

## 2022-11-05 LAB — LIPASE, BLOOD: Lipase: 27 U/L (ref 11–51)

## 2022-11-05 MED ORDER — ONDANSETRON HCL 4 MG/2ML IJ SOLN
4.0000 mg | Freq: Four times a day (QID) | INTRAMUSCULAR | Status: DC | PRN
  Administered 2022-11-05: 4 mg via INTRAVENOUS
  Filled 2022-11-05: qty 2

## 2022-11-05 MED ORDER — ACETAMINOPHEN 650 MG RE SUPP: 650.0000 mg | Freq: Four times a day (QID) | RECTAL | Status: DC | PRN

## 2022-11-05 MED ORDER — SODIUM CHLORIDE 0.9 % IV SOLN
12.5000 mg | Freq: Once | INTRAVENOUS | Status: AC
  Administered 2022-11-05: 12.5 mg via INTRAVENOUS
  Filled 2022-11-05: qty 0.5

## 2022-11-05 MED ORDER — HYDROMORPHONE HCL 1 MG/ML IJ SOLN
0.5000 mg | Freq: Once | INTRAMUSCULAR | Status: AC
  Administered 2022-11-05: 0.5 mg via INTRAVENOUS
  Filled 2022-11-05: qty 0.5

## 2022-11-05 MED ORDER — LACTATED RINGERS IV BOLUS
1000.0000 mL | Freq: Once | INTRAVENOUS | Status: AC
  Administered 2022-11-05: 1000 mL via INTRAVENOUS

## 2022-11-05 MED ORDER — HEPARIN SODIUM (PORCINE) 5000 UNIT/ML IJ SOLN
5000.0000 [IU] | Freq: Three times a day (TID) | INTRAMUSCULAR | Status: DC
  Administered 2022-11-05 – 2022-11-06 (×2): 5000 [IU] via SUBCUTANEOUS
  Filled 2022-11-05 (×2): qty 1

## 2022-11-05 MED ORDER — ONDANSETRON HCL 4 MG PO TABS
4.0000 mg | ORAL_TABLET | Freq: Four times a day (QID) | ORAL | Status: DC | PRN
  Administered 2022-11-06: 4 mg via ORAL
  Filled 2022-11-05 (×2): qty 1

## 2022-11-05 MED ORDER — ONDANSETRON HCL 4 MG/2ML IJ SOLN
4.0000 mg | Freq: Once | INTRAMUSCULAR | Status: AC
  Administered 2022-11-05: 4 mg via INTRAVENOUS
  Filled 2022-11-05: qty 2

## 2022-11-05 MED ORDER — PANTOPRAZOLE SODIUM 40 MG PO TBEC
40.0000 mg | DELAYED_RELEASE_TABLET | Freq: Every day | ORAL | Status: DC
  Administered 2022-11-05 – 2022-11-06 (×2): 40 mg via ORAL
  Filled 2022-11-05 (×2): qty 1

## 2022-11-05 MED ORDER — DIAZEPAM 5 MG/ML IJ SOLN
5.0000 mg | Freq: Once | INTRAMUSCULAR | Status: AC
  Administered 2022-11-05: 5 mg via INTRAVENOUS
  Filled 2022-11-05: qty 2

## 2022-11-05 MED ORDER — ACETAMINOPHEN 325 MG PO TABS: 650.0000 mg | ORAL_TABLET | Freq: Four times a day (QID) | ORAL | Status: DC | PRN

## 2022-11-05 MED ORDER — SODIUM CHLORIDE 0.9 % IV SOLN: INTRAVENOUS | Status: AC

## 2022-11-05 NOTE — ED Provider Notes (Signed)
[facility] Provider Note   CSN: [id number] Arrival date & time: 11/05/22  0755     History  Chief Complaint  Patient presents with   Emesis    Kenneth Chang is a 32 y.o. male.  Pt with nausea and vomiting. Symptoms acute onset 4-5 days ago. Emesis c/w recently ingested food/liquids, not bloody or bilious. No abd distension. No current abd pain. Having normal bms. History intermittent nausea/vomiting in past but states usually does not last this long. No hx pud. No prior abd surgery. Denies known ill contacts or bad food ingestion. No fever or chills. +hx thc use.   The history is provided by the patient and medical records.  Emesis Associated symptoms: no abdominal pain, no fever, no headaches and no sore throat        Home Medications Prior to Admission medications   Medication Sig Start Date End Date Taking? Authorizing Provider  ondansetron (ZOFRAN-ODT) 4 MG disintegrating tablet Take 1 tablet (4 mg total) by mouth every 8 (eight) hours as needed for nausea or vomiting. 11/01/22  Yes Hayden Rasmussen, MD  pantoprazole (PROTONIX) 40 MG tablet Take 1 tablet (40 mg total) by mouth daily. 11/01/22  Yes Hayden Rasmussen, MD  cyclobenzaprine (FLEXERIL) 10 MG tablet Take 1 tablet (10 mg total) by mouth 2 (two) times daily as needed for muscle spasms. Patient not taking: Reported on 11/05/2022 12/29/21   Marcello Fennel, PA-C      Allergies    Patient has no known allergies.    Review of Systems   Review of Systems  Constitutional:  Negative for fever.  HENT:  Negative for sore throat.   Eyes:  Negative for redness.  Respiratory:  Negative for shortness of breath.   Cardiovascular:  Negative for chest pain.  Gastrointestinal:  Positive for nausea and vomiting. Negative for abdominal pain.  Genitourinary:  Negative for dysuria and flank pain.  Musculoskeletal:  Negative for back pain and neck pain.  Skin:  Negative for rash.   Neurological:  Negative for headaches.  Hematological:  Does not bruise/bleed easily.  Psychiatric/Behavioral:  Negative for confusion.     Physical Exam Updated Vital Signs BP (!) 173/99   Pulse 78   Temp 98.2 F (36.8 C) (Axillary)   Resp 16   Ht 1.778 m ('5\' 10"'$ )   Wt 108.8 kg   SpO2 96%   BMI 34.42 kg/m  Physical Exam Vitals and nursing note reviewed.  Constitutional:      Appearance: Normal appearance. He is well-developed.  HENT:     Head: Atraumatic.     Nose: Nose normal.     Mouth/Throat:     Mouth: Mucous membranes are moist.  Eyes:     General: No scleral icterus.    Conjunctiva/sclera: Conjunctivae normal.  Neck:     Trachea: No tracheal deviation.  Cardiovascular:     Rate and Rhythm: Normal rate and regular rhythm.     Pulses: Normal pulses.     Heart sounds: Normal heart sounds. No murmur heard.    No friction rub. No gallop.  Pulmonary:     Effort: Pulmonary effort is normal. No accessory muscle usage or respiratory distress.     Breath sounds: Normal breath sounds.  Abdominal:     General: Bowel sounds are normal. There is no distension.     Palpations: Abdomen is soft.     Tenderness: There is no abdominal tenderness. There is no  guarding.  Genitourinary:    Comments: No cva tenderness. Musculoskeletal:        General: No swelling or tenderness.     Cervical back: Normal range of motion and neck supple. No rigidity.  Skin:    General: Skin is warm and dry.     Findings: No rash.  Neurological:     Mental Status: He is alert.     Comments: Alert, speech clear.   Psychiatric:        Mood and Affect: Mood normal.     ED Results / Procedures / Treatments   Labs (all labs ordered are listed, but only abnormal results are displayed) Results for orders placed or performed during the hospital encounter of 11/05/22  CBC  Result Value Ref Range   WBC 8.4 4.0 - 10.5 K/uL   RBC 5.25 4.22 - 5.81 MIL/uL   Hemoglobin 14.4 13.0 - 17.0 g/dL   HCT  44.1 39.0 - 52.0 %   MCV 84.0 80.0 - 100.0 fL   MCH 27.4 26.0 - 34.0 pg   MCHC 32.7 30.0 - 36.0 g/dL   RDW 13.8 11.5 - 15.5 %   Platelets 232 150 - 400 K/uL   nRBC 0.0 0.0 - 0.2 %  Comprehensive metabolic panel  Result Value Ref Range   Sodium 134 (L) 135 - 145 mmol/L   Potassium 4.4 3.5 - 5.1 mmol/L   Chloride 97 (L) 98 - 111 mmol/L   CO2 26 22 - 32 mmol/L   Glucose, Bld 132 (H) 70 - 99 mg/dL   BUN 50 (H) 6 - 20 mg/dL   Creatinine, Ser 2.45 (H) 0.61 - 1.24 mg/dL   Calcium 8.7 (L) 8.9 - 10.3 mg/dL   Total Protein 7.1 6.5 - 8.1 g/dL   Albumin 3.3 (L) 3.5 - 5.0 g/dL   AST 26 15 - 41 U/L   ALT 35 0 - 44 U/L   Alkaline Phosphatase 98 38 - 126 U/L   Total Bilirubin 0.7 0.3 - 1.2 mg/dL   GFR, Estimated 35 (L) >60 mL/min   Anion gap 11 5 - 15  Lipase, blood  Result Value Ref Range   Lipase 27 11 - 51 U/L      EKG None  Radiology CT ABDOMEN PELVIS WO CONTRAST  Result Date: 11/05/2022 CLINICAL DATA:  32 year old with chronic nausea and vomiting. Bowel obstruction suspected. EXAM: CT ABDOMEN AND PELVIS WITHOUT CONTRAST TECHNIQUE: Multidetector CT imaging of the abdomen and pelvis was performed following the standard protocol without IV contrast. RADIATION DOSE REDUCTION: This exam was performed according to the departmental dose-optimization program which includes automated exposure control, adjustment of the mA and/or kV according to patient size and/or use of iterative reconstruction technique. COMPARISON:  None Available. FINDINGS: Lower chest: Lung bases are clear. Hepatobiliary: Normal appearance of the liver and gallbladder. No biliary dilatation. Focal low-density in the liver near the falciform ligament is most compatible with focal fat. Pancreas: Unremarkable. No pancreatic ductal dilatation or surrounding inflammatory changes. Spleen: Normal in size without focal abnormality. Adrenals/Urinary Tract: Normal adrenal glands. Normal appearance of both kidneys without stones or  hydronephrosis. Normal appearance of the urinary bladder. Stomach/Bowel: Normal appearance of the stomach and small bowel. No bowel dilatation. Mild wall thickening in the transverse colon is likely related to incomplete distension. No pericolonic inflammation. Normal appearance of the appendix. Vascular/Lymphatic: No significant vascular findings are present. No enlarged abdominal or pelvic lymph nodes. Reproductive: Prostate and seminal vesicles are unremarkable. Other: Small left  inguinal hernia containing fat. Negative for free fluid. Negative for free air. Musculoskeletal: No acute bone abnormality. IMPRESSION: 1. No acute abnormality in the abdomen or pelvis. Mild wall thickening in the transverse colon is likely related to lack of distension as described. 2. Small left inguinal hernia containing fat. Electronically Signed   By: Markus Daft M.D.   On: 11/05/2022 12:06    Procedures Procedures    Medications Ordered in ED Medications  lactated ringers bolus 1,000 mL (has no administration in time range)  lactated ringers bolus 1,000 mL (has no administration in time range)  HYDROmorphone (DILAUDID) injection 0.5 mg (has no administration in time range)  promethazine (PHENERGAN) 12.5 mg in sodium chloride 0.9 % 50 mL IVPB (has no administration in time range)  lactated ringers bolus 1,000 mL (1,000 mLs Intravenous New Bag/Given 11/05/22 1001)  ondansetron (ZOFRAN) injection 4 mg (4 mg Intravenous Given 11/05/22 1001)  diazepam (VALIUM) injection 5 mg (5 mg Intravenous Given 11/05/22 1002)    ED Course/ Medical Decision Making/ A&P                             Medical Decision Making Problems Addressed: AKI (acute kidney injury) [facility]): acute illness or injury with systemic symptoms that poses a threat to life or bodily functions Cannabinoid hyperemesis syndrome: acute illness or injury Dehydration: acute illness or injury with systemic symptoms that poses a threat to life or bodily  functions Nausea and vomiting in adult: acute illness or injury with systemic symptoms  Amount and/or Complexity of Data Reviewed External Data Reviewed: labs and notes. Labs: ordered. Decision-making details documented in ED Course. Radiology: ordered and independent interpretation performed. Decision-making details documented in ED Course.  Risk Prescription drug management. Parenteral controlled substances. Decision regarding hospitalization.  Iv ns. Continuous pulse ox and cardiac monitoring. Labs ordered/sent. Imaging ordered.   Differential diagnosis includes acute gi illness, gastroenteritis, dehydration, aki, CHS, etc. Dispo decision including potential need for admission considered - will get labs and imaging and reassess.   Reviewed nursing notes and prior charts for additional history. External reports reviewed.   Cardiac monitor: sinus rhythm, rate 62.  Labs reviewed/interpreted by me - aki w elevated bun/cr. Additional ivf bolus.   LR bolus. Zofran iv. Valium iv.   Ct reviewed/interpreted by me - no sbo.   Persistent nausea. Some epigastric pain. Phenergan iv. Diluadid iv.   ?possible chs.  Hospitalists consulted for admission.            Final Clinical Impression(s) / ED Diagnoses Final diagnoses:  Nausea and vomiting in adult  Dehydration  AKI (acute kidney injury) ([facility])  Cannabinoid hyperemesis syndrome    Rx / DC Orders ED Discharge Orders     None         Lajean Saver, MD 11/05/22 1227

## 2022-11-05 NOTE — H&P (Addendum)
History and Physical    Kenneth Chang [id number] DOB: October 18, 1990 DOA: 11/05/2022  PCP: Patient, [name]  Patient coming from: Home  Chief Complaint: Nausea and vomiting  HPI: Kenneth Chang is a 32 y.o. male with medical history significant for current marijuana use and previous ED visits for intractable nausea/vomiting consistent with cannabinoid hyperemesis syndrome, now presenting to the ED on 3/12 with severe nausea and vomiting.  This current episode started on Wednesday 3/6 and he was seen in the ED on 3/8, then discharged with oral pantoprazole and ondansetron.  He returns today because he has continued vomiting since 3/8; the oral meds have not helped despite taking them as scheduled.  He denies blood or bile in his vomit.  Has not had any diarrhea, stomach pain, or fevers.  States that taking hot showers and exercising/increasing his heart rate has helped decrease the nausea.  He has been attempting to drink water and Pedialyte as much as possible but he is unable to keep them down consistently.  Has not tolerated any food intake.  Confirms that he has been smoking marijuana consistently since the time of his last hospitalization for hyperemesis syndrome in 2018.  He quit for about 3 weeks at the time, but then resumed.  Did have some similar nausea/vomiting in February, but it resolved on its own.   ED Course: Presented dehydrated and nauseous.  Given ondansetron, diazepam, and promethazine as well as 1 L bolus of LR x2 total.  Lipase WNL.  CMP showed mild hyponatremia and AKI with Cr 2.45.  Hospitalist team was consulted for admission for AKI in the setting of intractable vomiting.  Review of Systems: Reviewed as noted above, otherwise negative.  History reviewed. No pertinent past medical history.  Past Surgical History:  Procedure Laterality Date   HERNIA REPAIR      reports that he has quit smoking. His smoking use included cigars and cigarettes. He smoked an average  of .25 packs per day. He has never used smokeless tobacco. He reports current alcohol use of about 2.0 standard drinks of alcohol per week. He reports current drug use. Drug: Marijuana.  No Known Allergies  History reviewed. No pertinent family history.  Prior to Admission medications   Medication Sig Start Date End Date Taking? Authorizing Provider  ondansetron (ZOFRAN-ODT) 4 MG disintegrating tablet Take 1 tablet (4 mg total) by mouth every 8 (eight) hours as needed for nausea or vomiting. 11/01/22  Yes Hayden Rasmussen, MD  pantoprazole (PROTONIX) 40 MG tablet Take 1 tablet (40 mg total) by mouth daily. 11/01/22  Yes Hayden Rasmussen, MD  cyclobenzaprine (FLEXERIL) 10 MG tablet Take 1 tablet (10 mg total) by mouth 2 (two) times daily as needed for muscle spasms. Patient not taking: Reported on 11/05/2022 12/29/21   Marcello Fennel, PA-C    Physical Exam: Vitals:   11/05/22 0856 11/05/22 1008 11/05/22 1023 11/05/22 1314  BP:  (!) 173/99  121/73  Pulse:  78  78  Resp:    16  Temp: 98.2 F (36.8 C)   98 F (36.7 C)  TempSrc: Axillary   Oral  SpO2:  96%  100%  Weight:   108.8 kg   Height:   '5\' 10"'$  (1.778 m)     Examination: General: Adult male resting in bed in no acute distress, mild discomfort.  Obese.  Pleasant appropriate. HEENT: Dry mucous membranes, no mucosal pallor.  NCAT.  Oropharynx clear. Lungs: CTAB.  Normal work of breathing on  room air.  No wheezes/rhonchi/crackles. Cardiovascular: RRR.  Normal S1/S2.  No murmurs/rubs/gallops. Abdomen: Soft, nondistended.  No rebound or guarding.  No tenderness to palpation. GU: No CVA tenderness. Extremities: Warm, perfusing.  Cap refill 3 seconds.  Cyanosis or clubbing. Skin: No rashes or lesions grossly. Neuro: Alert and oriented x 4.  No focal neurological deficit.  Labs on Admission: I have personally reviewed following labs and imaging studies:  CBC: Recent Labs  Lab 11/01/22 2025 11/05/22 0943  WBC 11.5* 8.4   NEUTROABS 8.0*  --   HGB 14.8 14.4  HCT 43.7 44.1  MCV 81.2 84.0  PLT 250 [id number]   Basic Metabolic Panel: Recent Labs  Lab 11/01/22 2025 11/05/22 0943  NA 138 134*  K 3.6 4.4  CL 102 97*  CO2 20* 26  GLUCOSE 115* 132*  BUN 24* 50*  CREATININE 1.53* 2.45*  CALCIUM 9.6 8.7*   GFR: Estimated Creatinine Clearance: 54.4 mL/min (A) (by C-G formula based on SCr of 2.45 mg/dL (H)). Liver Function Tests: Recent Labs  Lab 11/01/22 2025 11/05/22 0943  AST 74* 26  ALT 5 35  ALKPHOS 46 98  BILITOT 1.4* 0.7  PROT 8.0 7.1  ALBUMIN 4.7 3.3*   Recent Labs  Lab 11/01/22 2025 11/05/22 0943  LIPASE 45 27   No results for input(s): "AMMONIA" in the last 168 hours. Coagulation Profile: No results for input(s): "INR", "PROTIME" in the last 168 hours. Cardiac Enzymes: No results for input(s): "CKTOTAL", "CKMB", "CKMBINDEX", "TROPONINI" in the last 168 hours. BNP (last 3 results) No results for input(s): "PROBNP" in the last 8760 hours. HbA1C: No results for input(s): "HGBA1C" in the last 72 hours. CBG: No results for input(s): "GLUCAP" in the last 168 hours. Lipid Profile: No results for input(s): "CHOL", "HDL", "LDLCALC", "TRIG", "CHOLHDL", "LDLDIRECT" in the last 72 hours. Thyroid Function Tests: No results for input(s): "TSH", "T4TOTAL", "FREET4", "T3FREE", "THYROIDAB" in the last 72 hours. Anemia Panel: No results for input(s): "VITAMINB12", "FOLATE", "FERRITIN", "TIBC", "IRON", "RETICCTPCT" in the last 72 hours. Urine analysis:    Component Value Date/Time   COLORURINE AMBER (A) 04/04/2017 0045   APPEARANCEUR HAZY (A) 04/04/2017 0045   LABSPEC 1.040 (H) 04/04/2017 0045   PHURINE 5.0 04/04/2017 0045   GLUCOSEU NEGATIVE 04/04/2017 0045   HGBUR NEGATIVE 04/04/2017 0045   BILIRUBINUR SMALL (A) 04/04/2017 0045   KETONESUR 80 (A) 04/04/2017 0045   PROTEINUR 100 (A) 04/04/2017 0045   NITRITE NEGATIVE 04/04/2017 0045   LEUKOCYTESUR NEGATIVE 04/04/2017 0045    Radiological  Exams on Admission: CT ABDOMEN PELVIS WO CONTRAST  Result Date: 11/05/2022 CLINICAL DATA:  32 year old with chronic nausea and vomiting. Bowel obstruction suspected. EXAM: CT ABDOMEN AND PELVIS WITHOUT CONTRAST TECHNIQUE: Multidetector CT imaging of the abdomen and pelvis was performed following the standard protocol without IV contrast. RADIATION DOSE REDUCTION: This exam was performed according to the departmental dose-optimization program which includes automated exposure control, adjustment of the mA and/or kV according to patient size and/or use of iterative reconstruction technique. COMPARISON:  None Available. FINDINGS: Lower chest: Lung bases are clear. Hepatobiliary: Normal appearance of the liver and gallbladder. No biliary dilatation. Focal low-density in the liver near the falciform ligament is most compatible with focal fat. Pancreas: Unremarkable. No pancreatic ductal dilatation or surrounding inflammatory changes. Spleen: Normal in size without focal abnormality. Adrenals/Urinary Tract: Normal adrenal glands. Normal appearance of both kidneys without stones or hydronephrosis. Normal appearance of the urinary bladder. Stomach/Bowel: Normal appearance of the stomach and small bowel.  No bowel dilatation. Mild wall thickening in the transverse colon is likely related to incomplete distension. No pericolonic inflammation. Normal appearance of the appendix. Vascular/Lymphatic: No significant vascular findings are present. No enlarged abdominal or pelvic lymph nodes. Reproductive: Prostate and seminal vesicles are unremarkable. Other: Small left inguinal hernia containing fat. Negative for free fluid. Negative for free air. Musculoskeletal: No acute bone abnormality. IMPRESSION: 1. No acute abnormality in the abdomen or pelvis. Mild wall thickening in the transverse colon is likely related to lack of distension as described. 2. Small left inguinal hernia containing fat. Electronically Signed   By: Markus Daft M.D.   On: 11/05/2022 12:06    Assessment/Plan Principal Problem:   AKI (acute kidney injury) ([place]) Active Problems:   Cannabinoid hyperemesis syndrome   Marijuana use  Intractable vomiting 2/2 cannabinoid hyperemesis syndrome (CHS) Given past history of hyperemesis and current presentation in the setting of marijuana use, CHS is the most likely etiology of this episode.  Will continue antiemetics and IVF resuscitation until patient is rehydrated and tolerating PO intake. - Ondansetron Q6h PRN - f/u EKG - f/u UDS, UA - PO challenge prior to discharge  AKI Dehydration Hyponatremia Cr 2.45 in ED, up from 1.53 on 3/8, unknown baseline.  Suspect prerenal etiology 2/2 dehydration.  Na 134 on admission. - Another 1 L bolus LR ordered in ED - IVF with 100 mL/hr NS  Marijuana use Advised patient to discontinue marijuana given the likelihood of hyperemesis syndrome.  Patient states that he plans to quit after this episode.  Obesity BMI 34.42 on admission.  Discussed lifestyle habits and increasing exercise or improving diet.  DVT prophylaxis: [place] heparin Code Status: Full Family Communication: None present Disposition Plan: Home in 1-2 days Consults called: None Admission status: Observation  Severity of Illness: The appropriate patient status for this patient is OBSERVATION. Observation status is judged to be reasonable and necessary in order to provide the required intensity of service to ensure the patient's safety. The patient's presenting symptoms, physical exam findings, and initial radiographic and laboratory data in the context of their medical condition is felt to place them at decreased risk for further clinical deterioration. Furthermore, it is anticipated that the patient will be medically stable for discharge from the hospital within 2 midnights of admission.   Velena Keegan Mining engineer, MS4 [facility]  If 7PM-7AM, please contact  night-coverage [email or web address]  11/05/2022, 1:28 PM

## 2022-11-05 NOTE — ED Triage Notes (Signed)
Pt here with reports of ongoing nausea and vomiting despite taking zofran and protonix. Denies fevers.

## 2022-11-06 DIAGNOSIS — F1290 Cannabis use, unspecified, uncomplicated: Secondary | ICD-10-CM | POA: Diagnosis not present

## 2022-11-06 DIAGNOSIS — N179 Acute kidney failure, unspecified: Secondary | ICD-10-CM | POA: Diagnosis not present

## 2022-11-06 DIAGNOSIS — R112 Nausea with vomiting, unspecified: Secondary | ICD-10-CM

## 2022-11-06 LAB — HIV ANTIBODY (ROUTINE TESTING W REFLEX): HIV Screen 4th Generation wRfx: NONREACTIVE

## 2022-11-06 LAB — BASIC METABOLIC PANEL
Anion gap: 13 (ref 5–15)
BUN: 15 mg/dL (ref 6–20)
CO2: 21 mmol/L — ABNORMAL LOW (ref 22–32)
Calcium: 8.6 mg/dL — ABNORMAL LOW (ref 8.9–10.3)
Chloride: 103 mmol/L (ref 98–111)
Creatinine, Ser: 1.17 mg/dL (ref 0.61–1.24)
GFR, Estimated: >60 mL/min (ref >60)
Glucose, Bld: 77 mg/dL (ref 70–99)
Potassium: 4 mmol/L (ref 3.5–5.1)
Sodium: 137 mmol/L (ref 135–145)

## 2022-11-06 LAB — CBC
HCT: 40.8 % (ref 39.0–52.0)
Hemoglobin: 13.3 g/dL (ref 13.0–17.0)
MCH: 27.6 pg (ref 26.0–34.0)
MCHC: 32.6 g/dL (ref 30.0–36.0)
MCV: 84.6 fL (ref 80.0–100.0)
Platelets: 223 10*3/uL (ref 150–400)
RBC: 4.82 MIL/uL (ref 4.22–5.81)
RDW: 13.7 % (ref 11.5–15.5)
WBC: 9.3 10*3/uL (ref 4.0–10.5)
nRBC: 0 % (ref 0.0–0.2)

## 2022-11-06 LAB — MAGNESIUM: Magnesium: 2.2 mg/dL (ref 1.7–2.4)

## 2022-11-06 MED ORDER — PROCHLORPERAZINE EDISYLATE 10 MG/2ML IJ SOLN
10.0000 mg | Freq: Four times a day (QID) | INTRAMUSCULAR | Status: DC | PRN
  Administered 2022-11-06: 10 mg via INTRAVENOUS
  Filled 2022-11-06: qty 2

## 2022-11-06 MED ORDER — PROMETHAZINE HCL 25 MG PO TABS: 25.0000 mg | ORAL_TABLET | Freq: Three times a day (TID) | ORAL | 0 refills | Status: DC | PRN

## 2022-11-06 NOTE — Discharge Instructions (Addendum)
Please follow soft food diet plan for at least one week, then add in solid foods as you are able. Please stop marijuana use, as that is likely causing your episodes of vomiting.   IMPORTANT INFORMATION: PAY CLOSE ATTENTION   PHYSICIAN DISCHARGE INSTRUCTIONS  Follow with Primary care provider  Patient, [name]  and other consultants as instructed by your Hospitalist Physician  [place] IF SYMPTOMS COME BACK, WORSEN OR NEW PROBLEM DEVELOPS   Please note: You were cared for by a hospitalist during your hospital stay. Every effort will be made to forward records to your primary care provider.  You can request that your primary care provider send for your hospital records if they have not received them.  Once you are discharged, your primary care physician will handle any further medical issues. Please note that NO REFILLS for any discharge medications will be authorized once you are discharged, as it is imperative that you return to your primary care physician (or establish a relationship with a primary care physician if you do not have one) for your post hospital discharge needs so that they can reassess your need for medications and monitor your lab values.  Please get a complete blood count and chemistry panel checked by your Primary MD at your next visit, and again as instructed by your Primary MD.  Get Medicines reviewed and adjusted: Please take all your medications with you for your next visit with your Primary MD  Laboratory/radiological data: Please request your Primary MD to go over all hospital tests and procedure/radiological results at the follow up, please ask your primary care provider to get all Hospital records sent to his/her office.  In some cases, they will be blood work, cultures and biopsy results pending at the time of your discharge. Please request that your primary care provider follow up on these results.  If you are diabetic,  please bring your blood sugar readings with you to your follow up appointment with primary care.    Please call and make your follow up appointments as soon as possible.    Also Note the following: If you experience worsening of your admission symptoms, develop shortness of breath, life threatening emergency, suicidal or homicidal thoughts you must seek medical attention immediately by calling 911 or calling your MD immediately  if symptoms less severe.  You must read complete instructions/literature along with all the possible adverse reactions/side effects for all the Medicines you take and that have been prescribed to you. Take any new Medicines after you have completely understood and accpet all the possible adverse reactions/side effects.   Do not drive when taking Pain medications or sleeping medications (Benzodiazepines)  Do not take more than prescribed Pain, Sleep and Anxiety Medications. It is not advisable to combine anxiety,sleep and pain medications without talking with your primary care practitioner  Special Instructions: If you have smoked or chewed Tobacco  in the last 2 yrs please stop smoking, stop any regular Alcohol  and or any Recreational drug use.  Wear Seat belts while driving.  Do not drive if taking any narcotic, mind altering or controlled substances or recreational drugs or alcohol.

## 2022-11-06 NOTE — Discharge Summary (Addendum)
Physician Discharge Summary  Kenneth Chang [id number] DOB: [birth year] DOA: 11/05/2022  Admit date: 11/05/2022  Discharge date: 11/06/2022  Admitted From: Home  Disposition: Home  Recommendations for Outpatient Follow-up:  Follow up with PCP in 1-2 weeks Please obtain BMP/CBC in one week UDS with THC and BZAs, recommended stopping intake of substances (especially THC due to hyperemesis) Has GI appointment 3/19 for follow up UA showed bacteriuria without leukocytes, patient asymptomatic, consider STI testing outpatient if patient interested  Home Health: None  Equipment/Devices: None  Discharge Condition: Stable  CODE STATUS: Full  Diet recommendation: Soft for 1 week  Brief/Interim Summary: Kenneth Chang is a 32 y.o. male with medical history significant for current marijuana use and previous ED visits for intractable nausea/vomiting consistent with cannabinoid hyperemesis syndrome, now presenting to the ED on 3/12 with severe nausea and vomiting since 3/6.  Came to ED on 3/8, then discharged same day with oral pantoprazole and ondansetron.  Returned this time with intractable vomiting and inability to tolerate PO intake.  Had been smoking marijuana frequently.  In ED, was given ondansetron, diazepam, and promethazine as well as 1 L bolus of LR x2.  Lipase WNL.  CMP showed mild hyponatremia and AKI with Cr 2.45.  Hospitalist team was consulted for admission for AKI in the setting of intractable vomiting.  Improved by following day with maintenance IVF and scheduled antiemetics.  UA did show bacteriuria, but patient asymptomatic.  Tolerated soft diet and PO meds at discharge.  Discharge Diagnoses:  Principal Problem:   AKI (acute kidney injury) ([place]) Active Problems:   Cannabinoid hyperemesis syndrome   Marijuana use  Hospitalized with cannabinoid hyperemesis syndrome in the setting of marijuana use.  Intractable vomiting 2/2 cannabinoid hyperemesis syndrome (CHS) Marijuana  use Given past history of hyperemesis and current presentation in the setting of marijuana use, CHS is the most likely etiology of this episode.  Received antiemetics and 1L LR bolus x3, improved.  Tolerating PO meds and soft diet at discharge.  Recommended stopping marijuana intake.   AKI Dehydration Hyponatremia Cr 2.45 and Na 134 on arrival in the setting of dehydration and severe emesis.  Received 1 L LR bolus x3 and 100 mL/hr NS for 12 hours.  Cr improved to 1.17 at discharge with Na WNL.  Discharge Instructions   Allergies as of 11/06/2022   No Known Allergies      Medication List     STOP taking these medications    cyclobenzaprine 10 MG tablet Commonly known as: FLEXERIL       TAKE these medications    ondansetron 4 MG disintegrating tablet Commonly known as: ZOFRAN-ODT Take 1 tablet (4 mg total) by mouth every 8 (eight) hours as needed for nausea or vomiting.   pantoprazole 40 MG tablet Commonly known as: Protonix Take 1 tablet (40 mg total) by mouth daily.   promethazine 25 MG tablet Commonly known as: PHENERGAN Take 1 tablet (25 mg total) by mouth every 8 (eight) hours as needed for refractory nausea / vomiting.        Follow-up Information     Primary Care Provider. Schedule an appointment as soon as possible for a visit in 2 week(s).   Why: Hospital Follow Up               No Known Allergies  Consultations: None  Procedures/Studies: CT ABDOMEN PELVIS WO CONTRAST  Result Date: 11/05/2022 CLINICAL DATA:  32 year old with chronic nausea and vomiting. Bowel obstruction suspected. EXAM: CT ABDOMEN  AND PELVIS WITHOUT CONTRAST TECHNIQUE: Multidetector CT imaging of the abdomen and pelvis was performed following the standard protocol without IV contrast. RADIATION DOSE REDUCTION: This exam was performed according to the departmental dose-optimization program which includes automated exposure control, adjustment of the mA and/or kV according to  patient size and/or use of iterative reconstruction technique. COMPARISON:  None Available. FINDINGS: Lower chest: Lung bases are clear. Hepatobiliary: Normal appearance of the liver and gallbladder. No biliary dilatation. Focal low-density in the liver near the falciform ligament is most compatible with focal fat. Pancreas: Unremarkable. No pancreatic ductal dilatation or surrounding inflammatory changes. Spleen: Normal in size without focal abnormality. Adrenals/Urinary Tract: Normal adrenal glands. Normal appearance of both kidneys without stones or hydronephrosis. Normal appearance of the urinary bladder. Stomach/Bowel: Normal appearance of the stomach and small bowel. No bowel dilatation. Mild wall thickening in the transverse colon is likely related to incomplete distension. No pericolonic inflammation. Normal appearance of the appendix. Vascular/Lymphatic: No significant vascular findings are present. No enlarged abdominal or pelvic lymph nodes. Reproductive: Prostate and seminal vesicles are unremarkable. Other: Small left inguinal hernia containing fat. Negative for free fluid. Negative for free air. Musculoskeletal: No acute bone abnormality. IMPRESSION: 1. No acute abnormality in the abdomen or pelvis. Mild wall thickening in the transverse colon is likely related to lack of distension as described. 2. Small left inguinal hernia containing fat. Electronically Signed   By: Markus Daft M.D.   On: 11/05/2022 12:06     Discharge Exam: Vitals:   11/05/22 2151 11/06/22 0606  BP: 121/71 (!) 140/80  Pulse: 63 60  Resp: 20 18  Temp: 98.7 F (37.1 C) 98.6 F (37 C)  SpO2: 98% 98%   Vitals:   11/05/22 1341 11/05/22 1748 11/05/22 2151 11/06/22 0606  BP: 120/77 119/70 121/71 (!) 140/80  Pulse: 62 (!) 58 63 60  Resp: '18 20 20 18  '$ Temp: 98.9 F (37.2 C) 99.1 F (37.3 C) 98.7 F (37.1 C) 98.6 F (37 C)  TempSrc: Oral Oral Oral Oral  SpO2: 99% 100% 98% 98%  Weight: 101.7 kg     Height: '5\' 10"'$   (1.778 m)       General: Adult male resting in bed in no acute distress, mild discomfort.  Obese.  Pleasant and appropriate. HEENT: Moist mucous membranes, no mucosal pallor.  NCAT. Lungs: CTAB.  Normal work of breathing on room air.  No wheezes/rhonchi/crackles. Cardiovascular: RRR.  Normal S1/S2.  No murmurs/rubs/gallops. Abdomen: Soft, nondistended.  No rebound or guarding.  No tenderness to palpation. GU: No CVA tenderness. No suprapubic tenderness. Extremities: Warm, perfusing.  Cap refill < 2 seconds.  No cyanosis or clubbing. Skin: No rashes or lesions grossly. Neuro: Alert and oriented x 4.  No focal neurological deficit.   The results of significant diagnostics from this hospitalization (including imaging, microbiology, ancillary and laboratory) are listed below for reference.    Microbiology: No results found for this or any previous visit (from the past 240 hour(s)).   Labs: BNP (last 3 results) No results for input(s): "BNP" in the last 8760 hours. Basic Metabolic Panel: Recent Labs  Lab 11/01/22 2025 11/05/22 0943 11/06/22 0442  NA 138 134* 137  K 3.6 4.4 4.0  CL 102 97* 103  CO2 20* 26 21*  GLUCOSE 115* 132* 77  BUN 24* 50* 15  CREATININE 1.53* 2.45* 1.17  CALCIUM 9.6 8.7* 8.6*  MG  --   --  2.2   Liver Function Tests: Recent Labs  Lab 11/01/22 2025 11/05/22 0943  AST 74* 26  ALT 5 35  ALKPHOS 46 98  BILITOT 1.4* 0.7  PROT 8.0 7.1  ALBUMIN 4.7 3.3*   Recent Labs  Lab 11/01/22 2025 11/05/22 0943  LIPASE 45 27   No results for input(s): "AMMONIA" in the last 168 hours. CBC: Recent Labs  Lab 11/01/22 2025 11/05/22 0943 11/06/22 0442  WBC 11.5* 8.4 9.3  NEUTROABS 8.0*  --   --   HGB 14.8 14.4 13.3  HCT 43.7 44.1 40.8  MCV 81.2 84.0 84.6  PLT 250 232 223   Cardiac Enzymes: No results for input(s): "CKTOTAL", "CKMB", "CKMBINDEX", "TROPONINI" in the last 168 hours. BNP: Invalid input(s): "POCBNP" CBG: No results for input(s): "GLUCAP"  in the last 168 hours. D-Dimer No results for input(s): "DDIMER" in the last 72 hours. Hgb A1c No results for input(s): "HGBA1C" in the last 72 hours. Lipid Profile No results for input(s): "CHOL", "HDL", "LDLCALC", "TRIG", "CHOLHDL", "LDLDIRECT" in the last 72 hours. Thyroid function studies No results for input(s): "TSH", "T4TOTAL", "T3FREE", "THYROIDAB" in the last 72 hours.  Invalid input(s): "FREET3" Anemia work up No results for input(s): "VITAMINB12", "FOLATE", "FERRITIN", "TIBC", "IRON", "RETICCTPCT" in the last 72 hours. Urinalysis    Component Value Date/Time   COLORURINE YELLOW 11/05/2022 1330   APPEARANCEUR CLEAR 11/05/2022 1330   LABSPEC >1.030 (H) 11/05/2022 1330   PHURINE 6.0 11/05/2022 1330   GLUCOSEU NEGATIVE 11/05/2022 1330   HGBUR TRACE (A) 11/05/2022 1330   BILIRUBINUR SMALL (A) 11/05/2022 1330   KETONESUR >80 (A) 11/05/2022 1330   PROTEINUR 100 (A) 11/05/2022 1330   NITRITE NEGATIVE 11/05/2022 1330   LEUKOCYTESUR NEGATIVE 11/05/2022 1330   Sepsis Labs Recent Labs  Lab 11/01/22 2025 11/05/22 0943 11/06/22 0442  WBC 11.5* 8.4 9.3   Microbiology No results found for this or any previous visit (from the past 240 hour(s)).   Time coordinating discharge: 35 minutes  SIGNED:   D. Katy Fitch, MS4 [facility] 11/06/2022, 2:01 PM  If 7PM-7AM, please contact night-coverage [email or web address]  ATTENDING NOTE   Patient seen and examined with D. Mining engineer, Careers information officer. In addition to supervising the encounter, I played a key role in the decision making process as well as reviewed key findings.  Pt tolerating diet and AKI has resolved.  He was advised to avoid recreational substances and to establish care with a PCP.  He verbalized understanding.  He is stable to discharge home.    Gerlene Fee, MD, DACP How to contact the [facility] Attending or Consulting provider [place] or covering provider during after hours [place], for this patient?  Check  the care team in [facility] and look for a) attending/consulting [facility] provider listed and b) the [facility] team listed Log into [email or web address] and use [place]'s universal password to access. If you do not have the password, please contact the hospital operator. Locate the [facility] provider you are looking for under [facility] and page to a number that you can be directly reached. If you still have difficulty reaching the provider, please page the [facility] (Director on Call) for the Hospitalists listed on amion for assistance.

## 2022-11-06 NOTE — TOC Transition Note (Signed)
Transition of Care [facility]) - CM/SW Discharge Note   Patient Details  Name: Emjay Baris MRN: [id number] Date of Birth: June 08, 1991  Transition of Care [facility]) CM/SW Contact:  Boneta Lucks, RN Phone Number: 11/06/2022, 1:15 PM   Clinical Narrative:   Discharging home , no needs.    Final next level of care: Home/Self Care Barriers to Discharge: Barriers Resolved    Discharge Placement    Discharge Plan and Services Additional resources added to the After Visit Summary for         Social Determinants of Health (SDOH) Interventions SDOH Screenings   Tobacco Use: Medium Risk (11/05/2022)

## 2022-11-11 NOTE — Progress Notes (Unsigned)
GI Office Note    Referring Provider: No ref. provider found Primary Care Physician:  Patient, [name]  Primary Gastroenterologist:  Chief Complaint   No chief complaint on file.    History of Present Illness   Kenneth Chang is a 32 y.o. male presenting today for further evaluation of N/V. Recent admission for the same.  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     CT A/P with contrast 10/2022: IMPRESSION: 1. No acute abnormality in the abdomen or pelvis. Mild wall thickening in the transverse colon is likely related to lack of distension as described. 2. Small left inguinal hernia containing fat.     Medications   Current Outpatient Medications  Medication Sig Dispense Refill   ondansetron (ZOFRAN-ODT) 4 MG disintegrating tablet Take 1 tablet (4 mg total) by mouth every 8 (eight) hours as needed for nausea or vomiting. 20 tablet 0   pantoprazole (PROTONIX) 40 MG tablet Take 1 tablet (40 mg total) by mouth daily. 30 tablet 0   promethazine (PHENERGAN) 25 MG tablet Take 1 tablet (25 mg total) by mouth every 8 (eight) hours as needed for refractory nausea / vomiting. 12 tablet 0   No current facility-administered medications for this visit.    Allergies   Allergies as of 11/12/2022   (No Known Allergies)    Past Medical History   No past medical history on file.  Past Surgical History   Past Surgical History:  Procedure Laterality Date   HERNIA REPAIR      Past Family History   No family history on file.  Past Social History   Social History   Socioeconomic History   Marital status: Single    Spouse name: Not on file   Number of children: Not on file   Years of education: Not on file   Highest education level: Not on file  Occupational History   Not on file  Tobacco Use   Smoking status: Former    Packs/day: .25    Types: Cigars, Cigarettes   Smokeless tobacco: Never  Vaping Use   Vaping Use: Every day  Substance and Sexual  Activity   Alcohol use: Yes    Alcohol/week: 2.0 standard drinks of alcohol    Types: 1 Cans of beer, 1 Shots of liquor per week    Comment: socially   Drug use: Yes    Types: Marijuana   Sexual activity: Yes  Other Topics Concern   Not on file  Social History Narrative   Not on file   Social Determinants of Health   Financial Resource Strain: Not on file  Food Insecurity: Not on file  Transportation Needs: Not on file  Physical Activity: Not on file  Stress: Not on file  Social Connections: Not on file  Intimate Partner Violence: Not on file    Review of Systems   General: Negative for anorexia, weight loss, fever, chills, fatigue, weakness. Eyes: Negative for vision changes.  ENT: Negative for hoarseness, difficulty swallowing , nasal congestion. CV: Negative for chest pain, angina, palpitations, dyspnea on exertion, peripheral edema.  Respiratory: Negative for dyspnea at rest, dyspnea on exertion, cough, sputum, wheezing.  GI: See history of present illness. GU:  Negative for dysuria, hematuria, urinary incontinence, urinary frequency, nocturnal urination.  MS: Negative for joint pain, low back pain.  Derm: Negative for rash or itching.  Neuro: Negative for weakness, abnormal sensation, seizure, frequent headaches, memory loss,  confusion.  Psych: Negative for anxiety, depression, suicidal ideation,  hallucinations.  Endo: Negative for unusual weight change.  Heme: Negative for bruising or bleeding. Allergy: Negative for rash or hives.  Physical Exam   There were no vitals taken for this visit.   General: Well-nourished, well-developed in no acute distress.  Head: Normocephalic, atraumatic.   Eyes: Conjunctiva pink, no icterus. Mouth: Oropharyngeal mucosa moist and pink , no lesions erythema or exudate. Neck: Supple without thyromegaly, masses, or lymphadenopathy.  Lungs: Clear to auscultation bilaterally.  Heart: Regular rate and rhythm, no murmurs rubs or  gallops.  Abdomen: Bowel sounds are normal, nontender, nondistended, no hepatosplenomegaly or masses,  no abdominal bruits or hernia, no rebound or guarding.   Rectal: *** Extremities: No lower extremity edema. No clubbing or deformities.  Neuro: Alert and oriented x 4 , grossly normal neurologically.  Skin: Warm and dry, no rash or jaundice.   Psych: Alert and cooperative, normal mood and affect.  Labs   Lab Results  Component Value Date   CREATININE 1.17 11/06/2022   BUN 15 11/06/2022   NA 137 11/06/2022   K 4.0 11/06/2022   CL 103 11/06/2022   CO2 21 (L) 11/06/2022   Lab Results  Component Value Date   WBC 9.3 11/06/2022   HGB 13.3 11/06/2022   HCT 40.8 11/06/2022   MCV 84.6 11/06/2022   PLT 223 11/06/2022   Lab Results  Component Value Date   ALT 35 11/05/2022   AST 26 11/05/2022   ALKPHOS 98 11/05/2022   BILITOT 0.7 11/05/2022   Lab Results  Component Value Date   LIPASE 27 11/05/2022   UDS: positive benzos, THC Imaging Studies   CT ABDOMEN PELVIS WO CONTRAST  Result Date: 11/05/2022 CLINICAL DATA:  32 year old with chronic nausea and vomiting. Bowel obstruction suspected. EXAM: CT ABDOMEN AND PELVIS WITHOUT CONTRAST TECHNIQUE: Multidetector CT imaging of the abdomen and pelvis was performed following the standard protocol without IV contrast. RADIATION DOSE REDUCTION: This exam was performed according to the departmental dose-optimization program which includes automated exposure control, adjustment of the mA and/or kV according to patient size and/or use of iterative reconstruction technique. COMPARISON:  None Available. FINDINGS: Lower chest: Lung bases are clear. Hepatobiliary: Normal appearance of the liver and gallbladder. No biliary dilatation. Focal low-density in the liver near the falciform ligament is most compatible with focal fat. Pancreas: Unremarkable. No pancreatic ductal dilatation or surrounding inflammatory changes. Spleen: Normal in size without  focal abnormality. Adrenals/Urinary Tract: Normal adrenal glands. Normal appearance of both kidneys without stones or hydronephrosis. Normal appearance of the urinary bladder. Stomach/Bowel: Normal appearance of the stomach and small bowel. No bowel dilatation. Mild wall thickening in the transverse colon is likely related to incomplete distension. No pericolonic inflammation. Normal appearance of the appendix. Vascular/Lymphatic: No significant vascular findings are present. No enlarged abdominal or pelvic lymph nodes. Reproductive: Prostate and seminal vesicles are unremarkable. Other: Small left inguinal hernia containing fat. Negative for free fluid. Negative for free air. Musculoskeletal: No acute bone abnormality. IMPRESSION: 1. No acute abnormality in the abdomen or pelvis. Mild wall thickening in the transverse colon is likely related to lack of distension as described. 2. Small left inguinal hernia containing fat. Electronically Signed   By: Markus Daft M.D.   On: 11/05/2022 12:06    Assessment       PLAN   ***   Laureen Ochs. Bobby Rumpf, [place], [facility]

## 2022-11-12 ENCOUNTER — Ambulatory Visit (INDEPENDENT_AMBULATORY_CARE_PROVIDER_SITE_OTHER): Payer: 59 | Admitting: Gastroenterology

## 2022-11-12 ENCOUNTER — Encounter: Payer: Self-pay | Admitting: Gastroenterology

## 2022-11-12 VITALS — BP 108/75 | HR 87 | Temp 97.8°F | Ht 70.0 in | Wt 219.0 lb

## 2022-11-12 DIAGNOSIS — F1290 Cannabis use, unspecified, uncomplicated: Secondary | ICD-10-CM

## 2022-11-12 DIAGNOSIS — R112 Nausea with vomiting, unspecified: Secondary | ICD-10-CM | POA: Diagnosis not present

## 2022-11-12 DIAGNOSIS — K920 Hematemesis: Secondary | ICD-10-CM

## 2022-11-12 MED ORDER — PANTOPRAZOLE SODIUM 40 MG PO TBEC: 40.0000 mg | DELAYED_RELEASE_TABLET | Freq: Every day | ORAL | 3 refills | Status: DC

## 2022-11-12 MED ORDER — PROMETHAZINE HCL 25 MG PO TABS: 25.0000 mg | ORAL_TABLET | Freq: Four times a day (QID) | ORAL | 0 refills | Status: DC | PRN

## 2022-11-12 NOTE — Patient Instructions (Addendum)
Continue pantoprazole 40mg  daily before breakfast.  Prescription for pantoprazole sent to the pharmacy. Continue Zofran alternating with promethazine as needed for nausea/vomiting.  Prescription for promethazine sent to the pharmacy. Upper endoscopy to be scheduled to evaluate blood in emesis and to rule out other causes for your nausea and vomiting.  See separate instructions. Recommend complete marijuana cessation.

## 2022-11-13 ENCOUNTER — Encounter: Payer: Self-pay | Admitting: *Deleted

## 2022-11-13 ENCOUNTER — Telehealth: Payer: Self-pay | Admitting: *Deleted

## 2022-11-13 ENCOUNTER — Telehealth: Payer: Self-pay | Admitting: Internal Medicine

## 2022-11-13 ENCOUNTER — Encounter: Payer: Self-pay | Admitting: Gastroenterology

## 2022-11-13 NOTE — Telephone Encounter (Signed)
Patient sent a MyChart message asking for a return to work note. [phone]

## 2022-11-13 NOTE — Telephone Encounter (Signed)
[employer] PA: Case Number: [id number] Review Date: 11/13/2022 10:22:01 AM Expiration Date: N/A Status: This member is not in scope for prior-authorization/notification for the services requested. You can save the case reference ID as validation of your request

## 2022-11-13 NOTE — Telephone Encounter (Signed)
Note has been done and faxed to the pt's employer

## 2022-12-05 ENCOUNTER — Telehealth: Payer: Self-pay | Admitting: *Deleted

## 2022-12-05 NOTE — Telephone Encounter (Signed)
Per Wynelle Beckmann in endo "Pt stated he is unable to come for procedure (EGD) scheduled tomorrow, 12/06/22. Needs to reschedule Thanks! "   Spoke with pt. He has been rescheduled to 5/3 at 11am. Aware will send new instructions to him. Also sent message to carolyn in endo making aware of change.

## 2022-12-05 NOTE — Progress Notes (Signed)
Pt states he is unable to come for scheduled EGD tomorrow 12/06/22 and needs to reschedule.  Message sent to [facility] @ Dr. Queen Blossom office.

## 2022-12-27 ENCOUNTER — Encounter (HOSPITAL_COMMUNITY)
Admission: RE | Disposition: A | Discharge status: Home / Self Care | Payer: Self-pay | Source: Home / Self Care | Attending: Internal Medicine

## 2022-12-27 ENCOUNTER — Ambulatory Visit (HOSPITAL_BASED_OUTPATIENT_CLINIC_OR_DEPARTMENT_OTHER): Payer: 59 | Admitting: Anesthesiology

## 2022-12-27 ENCOUNTER — Encounter (HOSPITAL_COMMUNITY): Payer: Self-pay

## 2022-12-27 ENCOUNTER — Ambulatory Visit (HOSPITAL_COMMUNITY)
Admission: RE | Admit: 2022-12-27 | Discharge: 2022-12-27 | Disposition: A | Discharge status: Home / Self Care | Payer: 59 | Attending: Internal Medicine | Admitting: Internal Medicine

## 2022-12-27 ENCOUNTER — Ambulatory Visit (HOSPITAL_COMMUNITY): Payer: 59 | Admitting: Anesthesiology

## 2022-12-27 ENCOUNTER — Other Ambulatory Visit: Payer: Self-pay

## 2022-12-27 DIAGNOSIS — K2970 Gastritis, unspecified, without bleeding: Secondary | ICD-10-CM | POA: Diagnosis not present

## 2022-12-27 DIAGNOSIS — Z87891 Personal history of nicotine dependence: Secondary | ICD-10-CM | POA: Diagnosis not present

## 2022-12-27 DIAGNOSIS — R112 Nausea with vomiting, unspecified: Secondary | ICD-10-CM | POA: Diagnosis present

## 2022-12-27 HISTORY — PX: BIOPSY: SHX5522

## 2022-12-27 HISTORY — PX: ESOPHAGOGASTRODUODENOSCOPY (EGD) WITH PROPOFOL: SHX5813

## 2022-12-27 SURGERY — ESOPHAGOGASTRODUODENOSCOPY (EGD) WITH PROPOFOL
Anesthesia: General

## 2022-12-27 MED ORDER — PROPOFOL 10 MG/ML IV BOLUS
INTRAVENOUS | Status: DC | PRN
  Administered 2022-12-27: 200 mg via INTRAVENOUS

## 2022-12-27 MED ORDER — LACTATED RINGERS IV SOLN
INTRAVENOUS | Status: DC
  Administered 2022-12-27: 1000 mL via INTRAVENOUS

## 2022-12-27 NOTE — Anesthesia Procedure Notes (Signed)
Date/Time: 12/27/2022 10:49 AM  Performed by: Franco Nones, CRNAPre-anesthesia Checklist: Patient identified, Emergency Drugs available, Suction available, Timeout performed and Patient being monitored Patient Re-evaluated:Patient Re-evaluated prior to induction Oxygen Delivery Method: Nasal Cannula

## 2022-12-27 NOTE — Discharge Instructions (Addendum)
EGD Discharge instructions Please read the instructions outlined below and refer to this sheet in the next few weeks. These discharge instructions provide you with general information on caring for yourself after you leave the hospital. Your doctor may also give you specific instructions. While your treatment has been planned according to the most current medical practices available, unavoidable complications occasionally occur. If you have any problems or questions after discharge, please call your doctor. ACTIVITY You may resume your regular activity but move at a slower pace for the next 24 hours.  Take frequent rest periods for the next 24 hours.  Walking will help expel (get rid of) the air and reduce the bloated feeling in your abdomen.  No driving for 24 hours (because of the anesthesia (medicine) used during the test).  You may shower.  Do not sign any important legal documents or operate any machinery for 24 hours (because of the anesthesia used during the test).  NUTRITION Drink plenty of fluids.  You may resume your normal diet.  Begin with a light meal and progress to your normal diet.  Avoid alcoholic beverages for 24 hours or as instructed by your caregiver.  MEDICATIONS You may resume your normal medications unless your caregiver tells you otherwise.  WHAT YOU CAN EXPECT TODAY You may experience abdominal discomfort such as a feeling of fullness or "gas" pains.  FOLLOW-UP Your doctor will discuss the results of your test with you.  SEEK IMMEDIATE MEDICAL ATTENTION IF ANY OF THE FOLLOWING OCCUR: Excessive nausea (feeling sick to your stomach) and/or vomiting.  Severe abdominal pain and distention (swelling).  Trouble swallowing.  Temperature over 101 F (37.8 C).  Rectal bleeding or vomiting of blood.    Your EGD revealed mild amount inflammation in your stomach.  I took biopsies of this to rule out infection with a bacteria called H. pylori.  Await pathology results, my  office will contact you.  I also took samples of your esophagus.  Small bowel appeared normal.  I want you to take pantoprazole every day regardless of symptoms until you come back to the office in 3 months.  Try to limit marijuana use.    I hope you have a great rest of your week!  Hennie Duos. Marletta Lor, D.O. Gastroenterology and Hepatology [facility]

## 2022-12-27 NOTE — Anesthesia Preprocedure Evaluation (Signed)
Anesthesia Evaluation  Patient identified by MRN, date of birth, ID band Patient awake    Reviewed: Allergy & Precautions, H&P , NPO status , Patient's Chart, lab work & pertinent test results, reviewed documented beta blocker date and time   Airway Mallampati: II  TM Distance: >3 FB Neck ROM: full    Dental no notable dental hx.    Pulmonary neg pulmonary ROS, former smoker   Pulmonary exam normal breath sounds clear to auscultation       Cardiovascular Exercise Tolerance: Good negative cardio ROS  Rhythm:regular Rate:Normal     Neuro/Psych negative neurological ROS  negative psych ROS   GI/Hepatic negative GI ROS, Neg liver ROS,,,  Endo/Other  negative endocrine ROS    Renal/GU Renal diseasenegative Renal ROS  negative genitourinary   Musculoskeletal   Abdominal   Peds  Hematology negative hematology ROS (+)   Anesthesia Other Findings   Reproductive/Obstetrics negative OB ROS                             Anesthesia Physical Anesthesia Plan  ASA: 1  Anesthesia Plan: General   Post-op Pain Management:    Induction:   PONV Risk Score and Plan: Propofol infusion  Airway Management Planned:   Additional Equipment:   Intra-op Plan:   Post-operative Plan:   Informed Consent: I have reviewed the patients History and Physical, chart, labs and discussed the procedure including the risks, benefits and alternatives for the proposed anesthesia with the patient or authorized representative who has indicated his/her understanding and acceptance.     Dental Advisory Given  Plan Discussed with: CRNA  Anesthesia Plan Comments:        Anesthesia Quick Evaluation

## 2022-12-27 NOTE — H&P (Signed)
Primary Care Physician:  Patient, [name] Primary Gastroenterologist:  Dr. Marletta Lor  Pre-Procedure History & Physical: HPI:  Kenneth Chang is a 32 y.o. male is here for an EGD to be performed for chronic nausea and vomiting  History reviewed. No pertinent past medical history.  Past Surgical History:  Procedure Laterality Date   HERNIA REPAIR      Prior to Admission medications   Medication Sig Start Date End Date Taking? Authorizing Provider  ibuprofen (ADVIL) 200 MG tablet Take 800 mg by mouth every 6 (six) hours as needed for moderate pain.   Yes [provider]  ondansetron (ZOFRAN-ODT) 4 MG disintegrating tablet Take 1 tablet (4 mg total) by mouth every 8 (eight) hours as needed for nausea or vomiting. Patient not taking: Reported on 12/26/2022 11/01/22   Terrilee Files, MD  pantoprazole (PROTONIX) 40 MG tablet Take 1 tablet (40 mg total) by mouth daily before breakfast. Patient taking differently: Take 40 mg by mouth daily as needed (acid reflux). 11/12/22   Tiffany Kocher, PA-C  promethazine (PHENERGAN) 25 MG tablet Take 1 tablet (25 mg total) by mouth every 6 (six) hours as needed for refractory nausea / vomiting. 11/12/22   Tiffany Kocher, PA-C    Allergies as of 11/13/2022   (No Known Allergies)    Family History  Problem Relation Age of Onset   Prostate cancer Maternal Grandfather    Colon cancer Neg Hx    Crohn's disease Neg Hx     Social History   Socioeconomic History   Marital status: Single    Spouse name: Not on file   Number of children: Not on file   Years of education: Not on file   Highest education level: Not on file  Occupational History   Not on file  Tobacco Use   Smoking status: Former    Packs/day: .25    Types: Cigars, Cigarettes   Smokeless tobacco: Never  Vaping Use   Vaping Use: Every day  Substance and Sexual Activity   Alcohol use: Yes    Alcohol/week: 2.0 standard drinks of alcohol    Types: 1 Cans of beer, 1 Shots of  liquor per week    Comment: socially   Drug use: Yes    Types: Marijuana   Sexual activity: Yes  Other Topics Concern   Not on file  Social History Narrative   Not on file   Social Determinants of Health   Financial Resource Strain: Not on file  Food Insecurity: Not on file  Transportation Needs: Not on file  Physical Activity: Not on file  Stress: Not on file  Social Connections: Not on file  Intimate Partner Violence: Not on file    Review of Systems: General: Negative for fever, chills, fatigue, weakness. Eyes: Negative for vision changes.  ENT: Negative for hoarseness, difficulty swallowing , nasal congestion. CV: Negative for chest pain, angina, palpitations, dyspnea on exertion, peripheral edema.  Respiratory: Negative for dyspnea at rest, dyspnea on exertion, cough, sputum, wheezing.  GI: See history of present illness. GU:  Negative for dysuria, hematuria, urinary incontinence, urinary frequency, nocturnal urination.  MS: Negative for joint pain, low back pain.  Derm: Negative for rash or itching.  Neuro: Negative for weakness, abnormal sensation, seizure, frequent headaches, memory loss, confusion.  Psych: Negative for anxiety, depression Endo: Negative for unusual weight change.  Heme: Negative for bruising or bleeding. Allergy: Negative for rash or hives.  Physical Exam: Vital signs in last 24  hours: Temp:  [98.4 F (36.9 C)] 98.4 F (36.9 C) (05/03 1002) Pulse Rate:  [67] 67 (05/03 1002) Resp:  [17] 17 (05/03 1002) BP: (125)/(80) 125/80 (05/03 1002) SpO2:  [98 %] 98 % (05/03 1002) Weight:  [102.1 kg] 102.1 kg (05/03 1002)   General:   Alert,  Well-developed, well-nourished, pleasant and cooperative in NAD Head:  Normocephalic and atraumatic. Eyes:  Sclera clear, no icterus.   Conjunctiva pink. Ears:  Normal auditory acuity. Nose:  No deformity, discharge,  or lesions. Msk:  Symmetrical without gross deformities. Normal posture. Extremities:  Without  clubbing or edema. Neurologic:  Alert and  oriented x4;  grossly normal neurologically. Skin:  Intact without significant lesions or rashes. Psych:  Alert and cooperative. Normal mood and affect.   Impression/Plan: Kenneth Chang is here for an EGD to be performed for chronic nausea and vomiting  Risks, benefits, limitations, imponderables and alternatives regarding EGD have been reviewed with the patient. Questions have been answered. All parties agreeable.

## 2022-12-27 NOTE — Transfer of Care (Signed)
Immediate Anesthesia Transfer of Care Note  Patient: Summer Haenel  Procedure(s) Performed: ESOPHAGOGASTRODUODENOSCOPY (EGD) WITH PROPOFOL BIOPSY  Patient Location: Endoscopy Unit  Anesthesia Type:General  Level of Consciousness: awake  Airway & Oxygen Therapy: Patient Spontanous Breathing  Post-op Assessment: Report given to RN and Post -op Vital signs reviewed and stable  Post vital signs: Reviewed and stable  Last Vitals:  Vitals Value Taken Time  BP 117/60 12/27/22 1103  Temp 36.7 C 12/27/22 1103  Pulse 75 12/27/22 1103  Resp 18 12/27/18  1103  SpO2 98 % 12/27/22 1103    Last Pain:  Vitals:   12/27/22 1103  TempSrc: Oral  PainSc: 0-No pain      Patients Stated Pain Goal: 7 (12/27/22 1002)  Complications: No notable events documented.

## 2022-12-27 NOTE — Op Note (Signed)
[facility] Patient Name: Kenneth Chang Procedure Date: 12/27/2022 10:43 AM MRN: [id number] Date of Birth: 1990/11/17 Attending MD: Hennie Duos. Marletta Lor , [place], [id number] CSN: [id number] Age: 32 Admit Type: Outpatient Procedure:                Upper GI endoscopy Indications:              Nausea with vomiting Providers:                Hennie Duos. Marletta Lor, DO, Jessica Boudreaux, Burke Keels, Technician Referring MD:              Medicines:                See the Anesthesia note for documentation of the                            administered medications Complications:            No immediate complications. Estimated Blood Loss:     Estimated blood loss was minimal. Procedure:                Pre-Anesthesia Assessment:                           - The anesthesia plan was to use monitored                            anesthesia care (MAC).                           After obtaining informed consent, the endoscope was                            passed under direct vision. Throughout the                            procedure, the patient's blood pressure, pulse, and                            oxygen saturations were monitored continuously. The                            [id number]-H190 ([id number]) scope was introduced through the                            mouth, and advanced to the second part of duodenum.                            The upper GI endoscopy was accomplished without                            difficulty. The patient tolerated the procedure                            well. Scope In: 10:54:50 AM  Scope Out: 10:58:02 AM Total Procedure Duration: 0 hours 3 minutes 12 seconds  Findings:      The Z-line was regular and was found 38 cm from the incisors.      Biopsies were taken with a cold forceps in the middle third of the       esophagus for histology.      Patchy mild inflammation characterized by erythema was found in the       gastric body and in the gastric  antrum. Biopsies were taken with a cold       forceps for histology.      The duodenal bulb, first portion of the duodenum and second portion of       the duodenum were normal. Impression:               - Z-line regular, 38 cm from the incisors.                           - Gastritis. Biopsied.                           - Normal duodenal bulb, first portion of the                            duodenum and second portion of the duodenum.                           - Biopsies were taken with a cold forceps for                            histology in the middle third of the esophagus. Moderate Sedation:      Per Anesthesia Care Recommendation:           - Patient has a contact number available for                            emergencies. The signs and symptoms of potential                            delayed complications were discussed with the                            patient. Return to normal activities tomorrow.                            Written discharge instructions were provided to the                            patient.                           - Resume previous diet.                           - Continue present medications.                           - Await pathology  results.                           - Use Protonix (pantoprazole) 40 mg PO daily.                            Patient counseled to take this every day                           - Return to GI clinic in 3 months. Procedure Code(s):        --- Professional ---                           [phone], Esophagogastroduodenoscopy, flexible,                            transoral; with biopsy, single or multiple Diagnosis Code(s):        --- Professional ---                           K29.70, Gastritis, unspecified, without bleeding                           R11.2, Nausea with vomiting, unspecified CPT copyright 2022 American Medical Association. All rights reserved. The codes documented in this report are preliminary and upon coder  review may  be revised to meet current compliance requirements. Hennie Duos. Marletta Lor, DO Hennie Duos. Marletta Lor, DO 12/27/2022 11:03:33 AM This report has been signed electronically. Number of Addenda: 0

## 2022-12-29 NOTE — Anesthesia Postprocedure Evaluation (Signed)
Anesthesia Post Note  Patient: Napoleon Yerena  Procedure(s) Performed: ESOPHAGOGASTRODUODENOSCOPY (EGD) WITH PROPOFOL BIOPSY  Patient location during evaluation: Phase II Anesthesia Type: General Level of consciousness: awake Pain management: pain level controlled Vital Signs Assessment: post-procedure vital signs reviewed and stable Respiratory status: spontaneous breathing and respiratory function stable Cardiovascular status: blood pressure returned to baseline and stable Postop Assessment: no headache and no apparent nausea or vomiting Anesthetic complications: no Comments: Late entry   No notable events documented.   Last Vitals:  Vitals:   12/27/22 1002 12/27/22 1103  BP: 125/80 117/60  Pulse: 67 75  Resp: 17   Temp: 36.9 C 36.7 C  SpO2: 98% 98%    Last Pain:  Vitals:   12/27/22 1103  TempSrc: Oral  PainSc: 0-No pain                 Windell Norfolk

## 2022-12-30 LAB — SURGICAL PATHOLOGY

## 2023-01-03 ENCOUNTER — Encounter (HOSPITAL_COMMUNITY): Payer: Self-pay | Admitting: Internal Medicine

## 2023-01-31 ENCOUNTER — Encounter: Payer: Self-pay | Admitting: Gastroenterology

## 2023-02-25 ENCOUNTER — Other Ambulatory Visit: Payer: Self-pay | Admitting: Family Medicine

## 2023-02-25 ENCOUNTER — Ambulatory Visit: Payer: Self-pay

## 2023-02-25 DIAGNOSIS — Z0000 Encounter for general adult medical examination without abnormal findings: Secondary | ICD-10-CM

## 2023-03-31 ENCOUNTER — Encounter: Payer: Self-pay | Admitting: Family Medicine

## 2023-03-31 ENCOUNTER — Ambulatory Visit (INDEPENDENT_AMBULATORY_CARE_PROVIDER_SITE_OTHER): Payer: 59 | Admitting: Family Medicine

## 2023-03-31 VITALS — BP 133/79 | HR 69 | Ht 70.0 in | Wt 222.1 lb

## 2023-03-31 DIAGNOSIS — E559 Vitamin D deficiency, unspecified: Secondary | ICD-10-CM | POA: Diagnosis not present

## 2023-03-31 DIAGNOSIS — F1290 Cannabis use, unspecified, uncomplicated: Secondary | ICD-10-CM | POA: Diagnosis not present

## 2023-03-31 DIAGNOSIS — E7849 Other hyperlipidemia: Secondary | ICD-10-CM

## 2023-03-31 DIAGNOSIS — Z23 Encounter for immunization: Secondary | ICD-10-CM | POA: Diagnosis not present

## 2023-03-31 DIAGNOSIS — R7301 Impaired fasting glucose: Secondary | ICD-10-CM

## 2023-03-31 DIAGNOSIS — E038 Other specified hypothyroidism: Secondary | ICD-10-CM

## 2023-03-31 NOTE — Patient Instructions (Addendum)
I appreciate the opportunity to provide care to you today!    Follow up:  4 months  Labs: please stop by the lab during the week to get your blood drawn (CBC, CMP, TSH, Lipid profile, HgA1c, Vit D)  Screening: HIV and Hep C  Smoking is harmful to your health and increases your risk for cancer, COPD, high blood pressure, cataracts, digestive problems, or health problems , such as gum disease, mouth sores, and tooth loss and loss of taste and smell. Smoking irritates your throat and causes coughing.    Thank you for getting your Tdap  vaccine today!    Attached with your AVS, you will find valuable resources for self-education. I highly recommend dedicating some time to thoroughly examine them.   Please continue to a heart-healthy diet and increase your physical activities. Try to exercise for at least five days a week.    It was a pleasure to see you and I look forward to continuing to work together on your health and well-being. Please do not hesitate to call the office if you need care or have questions about your care.  In case of emergency, please visit the Emergency Department for urgent care, or contact our clinic at [phone] to schedule an appointment. We're here to help you!   Have a wonderful day and week. With Gratitude, Gilmore Laroche MSN, FNP-BC

## 2023-03-31 NOTE — Assessment & Plan Note (Addendum)
Smoking cessation encouraged No reports of SOB, dyspnea, palpitation, or orthopnea Smoking is harmful to your health and increases your risk for cancer, COPD, high blood pressure, cataracts, digestive problems, or health problems , such as gum disease, mouth sores, and tooth loss and loss of taste and smell. Smoking irritates your throat and causes coughing.

## 2023-03-31 NOTE — Progress Notes (Signed)
[facility]  New Patient Office Visit  Subjective:  Patient ID: Kenneth Chang, male    DOB: 21-Dec-1990  Age: 32 y.o. MRN: [id number]  CC:  Chief Complaint  Patient presents with   Establish Care    New patient, establishing care.     HPI Kenneth Chang is a 32 y.o. male with past medical history of marijuana use presents for establishing care. For the details of today's visit, please refer to the assessment and plan.     History reviewed. No pertinent past medical history.  Past Surgical History:  Procedure Laterality Date   BIOPSY  12/27/2022   Procedure: BIOPSY;  Surgeon: Lanelle Bal, DO;  [facility];  Service: Endoscopy;;   ESOPHAGOGASTRODUODENOSCOPY (EGD) WITH PROPOFOL N/A 12/27/2022   Procedure: ESOPHAGOGASTRODUODENOSCOPY (EGD) WITH PROPOFOL;  Surgeon: Lanelle Bal, DO;  [facility]: [facility];  Service: Endoscopy;  Laterality: N/A;  12:30 pm   HERNIA REPAIR      Family History  Problem Relation Age of Onset   Prostate cancer Maternal Grandfather    Colon cancer Neg Hx    Crohn's disease Neg Hx     Social History   Socioeconomic History   Marital status: Single    Spouse name: Not on file   Number of children: Not on file   Years of education: Not on file   Highest education level: Not on file  Occupational History   Not on file  Tobacco Use   Smoking status: Former    Current packs/day: 0.25    Types: Cigars, Cigarettes   Smokeless tobacco: Never  Vaping Use   Vaping status: Every Day  Substance and Sexual Activity   Alcohol use: Yes    Alcohol/week: 2.0 standard drinks of alcohol    Types: 1 Cans of beer, 1 Shots of liquor per week    Comment: socially   Drug use: Yes    Types: Marijuana   Sexual activity: Yes  Other Topics Concern   Not on file  Social History Narrative   Not on file   Social Determinants of Health   Financial Resource Strain: Not on file  Food Insecurity: Not on file  Transportation Needs: Not on file   Physical Activity: Not on file  Stress: Not on file  Social Connections: Not on file  Intimate Partner Violence: Not on file    ROS Review of Systems  Constitutional:  Negative for fatigue and fever.  Eyes:  Negative for visual disturbance.  Respiratory:  Negative for chest tightness and shortness of breath.   Cardiovascular:  Negative for chest pain and palpitations.  Neurological:  Negative for dizziness and headaches.    Objective:   Today's Vitals: BP 133/79   Pulse 69   Ht 5\' 10"  (1.778 m)   Wt 222 lb 1.9 oz (100.8 kg)   SpO2 93%   BMI 31.87 kg/m   Physical Exam HENT:     Head: Normocephalic.     Right Ear: External ear normal.     Left Ear: External ear normal.     Nose: No congestion or rhinorrhea.     Mouth/Throat:     Mouth: Mucous membranes are moist.  Cardiovascular:     Rate and Rhythm: Regular rhythm.     Heart sounds: No murmur heard. Pulmonary:     Effort: No respiratory distress.     Breath sounds: Wheezing present.  Neurological:     Mental Status: He is alert.  Assessment & Plan:   Marijuana use Assessment & Plan: Smoking cessation encouraged No reports of SOB, dyspnea, palpitation, or orthopnea Smoking is harmful to your health and increases your risk for cancer, COPD, high blood pressure, cataracts, digestive problems, or health problems , such as gum disease, mouth sores, and tooth loss and loss of taste and smell. Smoking irritates your throat and causes coughing.    Immunization due -     Tdap vaccine greater than or equal to 7yo IM  IFG (impaired fasting glucose) -     Hemoglobin A1c  Vitamin D deficiency -     VITAMIN D 25 Hydroxy (Vit-D Deficiency, Fractures)  Other specified hypothyroidism -     TSH + free T4  Other hyperlipidemia -     Lipid panel -     CMP14+EGFR -     CBC with Differential/Platelet   Note: This chart has been completed using Engineer, civil (consulting) software, and while attempts have been made to  ensure accuracy, certain words and phrases may not be transcribed as intended.    Follow-up: Return in about 4 months (around 07/31/2023).   Gilmore Laroche, FNP

## 2023-05-08 ENCOUNTER — Telehealth: Payer: Self-pay

## 2023-05-08 NOTE — Telephone Encounter (Signed)
Good afternoon,  I see you schedule a appointment with our office [facility] for 06/12/2023 but you are a establish patient with [facility] and at this time our doctors are not accepting transfers from outer GI offices. Please call [facility] to make a follow up appointment with them. I am going to cancel the appointment with Office on 06/12/2023.  Thank you  Morrie Sheldon CMA  ===View-only below this line===   ----- Message -----      From:Raphel Nasby      Sent:05/08/2023  1:11 PM EDT        ZO[id number] scheduled from MyChart  Appointment for: Kenneth Chang ([id number]) Visit type: NEW PATIENT 30 (1003) 06/12/2023 8:15 AM (30 minutes) with Celso Amy in [facility]  Patient comments: Having intermittent stomach issues causing constant nausea.

## 2023-05-26 ENCOUNTER — Ambulatory Visit: Payer: Self-pay

## 2023-05-26 ENCOUNTER — Other Ambulatory Visit: Payer: Self-pay | Admitting: Nurse Practitioner

## 2023-05-26 DIAGNOSIS — Z021 Encounter for pre-employment examination: Secondary | ICD-10-CM

## 2023-06-12 ENCOUNTER — Ambulatory Visit: Payer: 59 | Admitting: Physician Assistant

## 2023-08-04 ENCOUNTER — Encounter: Payer: Self-pay | Admitting: Family Medicine

## 2023-08-04 ENCOUNTER — Ambulatory Visit (INDEPENDENT_AMBULATORY_CARE_PROVIDER_SITE_OTHER): Payer: 59 | Admitting: Family Medicine

## 2023-08-04 VITALS — BP 137/83 | HR 78 | Ht 70.0 in | Wt 231.1 lb

## 2023-08-04 DIAGNOSIS — E038 Other specified hypothyroidism: Secondary | ICD-10-CM

## 2023-08-04 DIAGNOSIS — R7303 Prediabetes: Secondary | ICD-10-CM

## 2023-08-04 DIAGNOSIS — E559 Vitamin D deficiency, unspecified: Secondary | ICD-10-CM

## 2023-08-04 DIAGNOSIS — E785 Hyperlipidemia, unspecified: Secondary | ICD-10-CM | POA: Diagnosis not present

## 2023-08-04 NOTE — Assessment & Plan Note (Signed)
The patient was educated on lifestyle modifications, including decreasing the intake of high-sugar foods and beverages, and increasing physical activity. The patient verbalized understanding and is aware of the plan of care. Lab Results  Component Value Date   HGBA1C 6.0 (H) 04/03/2023

## 2023-08-04 NOTE — Progress Notes (Signed)
Established Patient Office Visit  Subjective:  Patient ID: Kenneth Chang, male    DOB: Jan 11, 1991  Age: 33 y.o. MRN: [id number]  CC:  Chief Complaint  Patient presents with   Care Management    4 month f/u    HPI Kenneth Chang is a 32 y.o. male with past medical history of prediabetes, vitamin D deficiency, and hyperlipidemia presents for f/u of  chronic medical conditions. For the details of today's visit, please refer to the assessment and plan.     History reviewed. No pertinent past medical history.  Past Surgical History:  Procedure Laterality Date   BIOPSY  12/27/2022   Procedure: BIOPSY;  Surgeon: Lanelle Bal, DO;  [facility];  Service: Endoscopy;;   ESOPHAGOGASTRODUODENOSCOPY (EGD) WITH PROPOFOL N/A 12/27/2022   Procedure: ESOPHAGOGASTRODUODENOSCOPY (EGD) WITH PROPOFOL;  Surgeon: Lanelle Bal, DO;  [facility];  Service: Endoscopy;  Laterality: N/A;  12:30 pm   HERNIA REPAIR      Family History  Problem Relation Age of Onset   Prostate cancer Maternal Grandfather    Colon cancer Neg Hx    Crohn's disease Neg Hx     Social History   Socioeconomic History   Marital status: Single    Spouse name: Not on file   Number of children: Not on file   Years of education: Not on file   Highest education level: Not on file  Occupational History   Not on file  Tobacco Use   Smoking status: Former    Current packs/day: 0.25    Types: Cigars, Cigarettes   Smokeless tobacco: Never  Vaping Use   Vaping status: Every Day  Substance and Sexual Activity   Alcohol use: Yes    Alcohol/week: 2.0 standard drinks of alcohol    Types: 1 Cans of beer, 1 Shots of liquor per week    Comment: socially   Drug use: Yes    Types: Marijuana   Sexual activity: Yes  Other Topics Concern   Not on file  Social History Narrative   Not on file   Social Determinants of Health   Financial Resource Strain: Not on file  Food Insecurity: Not on file   Transportation Needs: Not on file  Physical Activity: Not on file  Stress: Not on file  Social Connections: Not on file  Intimate Partner Violence: Not on file    Outpatient Medications Prior to Visit  Medication Sig Dispense Refill   ibuprofen (ADVIL) 200 MG tablet Take 800 mg by mouth every 6 (six) hours as needed for moderate pain.     No facility-administered medications prior to visit.    No Known Allergies  ROS Review of Systems  Constitutional:  Negative for fatigue and fever.  Eyes:  Negative for visual disturbance.  Respiratory:  Negative for chest tightness and shortness of breath.   Cardiovascular:  Negative for chest pain and palpitations.  Neurological:  Negative for dizziness and headaches.      Objective:    Physical Exam HENT:     Head: Normocephalic.     Right Ear: External ear normal.     Left Ear: External ear normal.     Nose: No congestion or rhinorrhea.     Mouth/Throat:     Mouth: Mucous membranes are moist.  Cardiovascular:     Rate and Rhythm: Regular rhythm.     Heart sounds: No murmur heard. Pulmonary:     Effort: No respiratory distress.  Breath sounds: Normal breath sounds.  Neurological:     Mental Status: He is alert.     BP 137/83   Pulse 78   Ht 5\' 10"  (1.778 m)   Wt 231 lb 1.3 oz (104.8 kg)   SpO2 96%   BMI 33.16 kg/m  Wt Readings from Last 3 Encounters:  08/04/23 231 lb 1.3 oz (104.8 kg)  03/31/23 222 lb 1.9 oz (100.8 kg)  12/27/22 225 lb (102.1 kg)    Lab Results  Component Value Date   TSH 1.100 04/03/2023   Lab Results  Component Value Date   WBC 6.3 04/03/2023   HGB 15.8 04/03/2023   HCT 46.7 04/03/2023   MCV 79 04/03/2023   PLT 248 04/03/2023   Lab Results  Component Value Date   NA 141 04/03/2023   K 4.3 04/03/2023   CO2 23 04/03/2023   GLUCOSE 92 04/03/2023   BUN 10 04/03/2023   CREATININE 1.15 04/03/2023   BILITOT <0.2 04/03/2023   ALKPHOS 82 04/03/2023   AST 16 04/03/2023   ALT 26  04/03/2023   PROT 6.6 04/03/2023   ALBUMIN 4.3 04/03/2023   CALCIUM 9.3 04/03/2023   ANIONGAP 13 11/06/2022   EGFR 87 04/03/2023   Lab Results  Component Value Date   CHOL 216 (H) 04/03/2023   Lab Results  Component Value Date   HDL 68 04/03/2023   Lab Results  Component Value Date   LDLCALC 127 (H) 04/03/2023   Lab Results  Component Value Date   TRIG 117 04/03/2023   Lab Results  Component Value Date   CHOLHDL 3.2 04/03/2023   Lab Results  Component Value Date   HGBA1C 6.0 (H) 04/03/2023      Assessment & Plan:  Prediabetes Assessment & Plan: The patient was educated on lifestyle modifications, including decreasing the intake of high-sugar foods and beverages, and increasing physical activity. The patient verbalized understanding and is aware of the plan of care. Lab Results  Component Value Date   HGBA1C 6.0 (H) 04/03/2023      Orders: -     Hemoglobin A1c  Hyperlipidemia LDL goal <100 Assessment & Plan: The patient's LDL goal is to be less than 100. The patient was educated on lifestyle modifications, including decreasing the intake of greasy, fatty, and starchy foods, while increasing physical activity. The patient verbalized understanding and is aware of the plan of care. Lab Results  Component Value Date   CHOL 216 (H) 04/03/2023   HDL 68 04/03/2023   LDLCALC 127 (H) 04/03/2023   TRIG 117 04/03/2023   CHOLHDL 3.2 04/03/2023     Orders: -     Lipid panel -     CMP14+EGFR -     CBC with Differential/Platelet  Vitamin D deficiency Assessment & Plan: Pending vit d levels Last vitamin D Lab Results  Component Value Date   VD25OH 23.0 (L) 04/03/2023     Orders: -     VITAMIN D 25 Hydroxy (Vit-D Deficiency, Fractures) -     TSH + free T4  TSH (thyroid-stimulating hormone deficiency) -     TSH + free T4  Note: This chart has been completed using Engineer, civil (consulting) software, and while attempts have been made to ensure accuracy,  certain words and phrases may not be transcribed as intended.    Follow-up: Return in about 4 months (around 12/03/2023).   Gilmore Laroche, FNP

## 2023-08-04 NOTE — Assessment & Plan Note (Signed)
Pending vit d levels Last vitamin D Lab Results  Component Value Date   VD25OH 23.0 (L) 04/03/2023

## 2023-08-04 NOTE — Assessment & Plan Note (Signed)
The patient's LDL goal is to be less than 100. The patient was educated on lifestyle modifications, including decreasing the intake of greasy, fatty, and starchy foods, while increasing physical activity. The patient verbalized understanding and is aware of the plan of care. Lab Results  Component Value Date   CHOL 216 (H) 04/03/2023   HDL 68 04/03/2023   LDLCALC 127 (H) 04/03/2023   TRIG 117 04/03/2023   CHOLHDL 3.2 04/03/2023

## 2023-08-04 NOTE — Patient Instructions (Signed)
I appreciate the opportunity to provide care to you today!    Follow up:  4 months  Fasting Labs: please stop by the lab today/during the week to get your blood drawn (CBC, CMP, TSH, Lipid profile, HgA1c, Vit D)  Here are some foods to avoid or reduce in your diet to help manage cholesterol levels:  Fried Foods:Deep-fried items such as french fries, fried chicken, and fried snacks are high in unhealthy fats and can raise LDL (bad) cholesterol levels. Processed Meats:Foods like bacon, sausage, hot dogs, and deli meats are often high in saturated fat and cholesterol. Full-Fat Dairy Products:Whole milk, full-fat yogurt, butter, cream, and cheese are rich in saturated fats, which can increase cholesterol levels. Baked Goods and Sweets:Pastries, cakes, cookies, and donuts often contain trans fats and added sugars, which can raise LDL cholesterol and lower HDL (good) cholesterol. Red Meat:Beef, lamb, and pork are high in saturated fat. Lean cuts or plant-based protein alternatives are better options. Lard and Shortening:Used in some baked goods, lard and shortening are high in trans fats and should be avoided. Fast Food:Many fast food items are cooked with unhealthy oils and contain high amounts of saturated and trans fats. Processed Snacks:Chips, crackers, and certain microwave popcorns can contain trans fats and high levels of unhealthy oils. Shellfish:While nutritious in other ways, some shellfish like shrimp, lobster, and crab are high in cholesterol. They should be consumed in moderation. Coconut and Palm Oils:these oils are high in saturated fat and can raise cholesterol levels when used in cooking or baking.    For managing prediabetes, I recommend the following lifestyle changes:  Reduce Intake of High-Sugar Foods and Beverages: Limit foods and drinks high in sugar to help regulate blood sugar levels. Increase Consumption of Nutrient-Rich Foods: Focus on incorporating more fruits,  vegetables, and whole grains into your diet. Choose Lean Proteins: Opt for lean proteins such as chicken, fish, beans, and legumes. Select Low-Fat Dairy Products: Choose low-fat or non-fat dairy options. Minimize Saturated Fats, Trans Fats, and Cholesterol: Reduce intake of foods high in saturated fats, trans fatty acids, and cholesterol. Engage in Regular Physical Activity: Aim for at least 30 minutes of brisk walking or other moderate activity at least 5 days a week.      Please continue to a heart-healthy diet and increase your physical activities. Try to exercise for at least five days a week.    It was a pleasure to see you and I look forward to continuing to work together on your health and well-being. Please do not hesitate to call the office if you need care or have questions about your care.  In case of emergency, please visit the Emergency Department for urgent care, or contact our clinic at [phone] to schedule an appointment. We're here to help you!   Have a wonderful day and week. With Gratitude, Gilmore Laroche MSN, FNP-BC

## 2023-08-05 LAB — CBC WITH DIFFERENTIAL/PLATELET
Basophils Absolute: 0 10*3/uL (ref 0.0–0.2)
Basos: 0 %
EOS (ABSOLUTE): 0.2 10*3/uL (ref 0.0–0.4)
Eos: 3 %
Hematocrit: 45.5 % (ref 37.5–51.0)
Hemoglobin: 14.9 g/dL (ref 13.0–17.7)
Immature Grans (Abs): 0 10*3/uL (ref 0.0–0.1)
Immature Granulocytes: 0 %
Lymphocytes Absolute: 3.1 10*3/uL (ref 0.7–3.1)
Lymphs: 46 %
MCH: 27.4 pg (ref 26.6–33.0)
MCHC: 32.7 g/dL (ref 31.5–35.7)
MCV: 84 fL (ref 79–97)
Monocytes Absolute: 0.8 10*3/uL (ref 0.1–0.9)
Monocytes: 11 %
Neutrophils Absolute: 2.6 10*3/uL (ref 1.4–7.0)
Neutrophils: 40 %
Platelets: 234 10*3/uL (ref 150–450)
RBC: 5.44 x10E6/uL (ref 4.14–5.80)
RDW: 13.9 % (ref 11.6–15.4)
WBC: 6.7 10*3/uL (ref 3.4–10.8)

## 2023-08-05 LAB — TSH+FREE T4
Free T4: 1.11 ng/dL (ref 0.82–1.77)
TSH: 1.62 u[IU]/mL (ref 0.450–4.500)

## 2023-08-05 LAB — CMP14+EGFR
ALT: 24 [IU]/L (ref 0–44)
AST: 18 [IU]/L (ref 0–40)
Albumin: 4.4 g/dL (ref 4.1–5.1)
Alkaline Phosphatase: 101 [IU]/L (ref 44–121)
BUN/Creatinine Ratio: 10 (ref 9–20)
BUN: 13 mg/dL (ref 6–20)
Bilirubin Total: <0.2 mg/dL (ref 0.0–1.2)
CO2: 24 mmol/L (ref 20–29)
Calcium: 9.5 mg/dL (ref 8.7–10.2)
Chloride: 105 mmol/L (ref 96–106)
Creatinine, Ser: 1.33 mg/dL — ABNORMAL HIGH (ref 0.76–1.27)
Globulin, Total: 2.3 g/dL (ref 1.5–4.5)
Glucose: 107 mg/dL — ABNORMAL HIGH (ref 70–99)
Potassium: 4.6 mmol/L (ref 3.5–5.2)
Sodium: 142 mmol/L (ref 134–144)
Total Protein: 6.7 g/dL (ref 6.0–8.5)
eGFR: 73 mL/min/{1.73_m2} (ref >59)

## 2023-08-05 LAB — LIPID PANEL
Chol/HDL Ratio: 3 {ratio} (ref 0.0–5.0)
Cholesterol, Total: 193 mg/dL (ref 100–199)
HDL: 65 mg/dL (ref >39)
LDL Chol Calc (NIH): 111 mg/dL — ABNORMAL HIGH (ref 0–99)
Triglycerides: 94 mg/dL (ref 0–149)
VLDL Cholesterol Cal: 17 mg/dL (ref 5–40)

## 2023-08-05 LAB — VITAMIN D 25 HYDROXY (VIT D DEFICIENCY, FRACTURES): Vit D, 25-Hydroxy: 9.8 ng/mL — ABNORMAL LOW (ref 30.0–100.0)

## 2023-08-05 LAB — HEMOGLOBIN A1C
Est. average glucose Bld gHb Est-mCnc: 123 mg/dL
Hgb A1c MFr Bld: 5.9 % — ABNORMAL HIGH (ref 4.8–5.6)

## 2023-08-12 ENCOUNTER — Other Ambulatory Visit: Payer: Self-pay | Admitting: Family Medicine

## 2023-08-12 DIAGNOSIS — E559 Vitamin D deficiency, unspecified: Secondary | ICD-10-CM

## 2023-08-12 MED ORDER — VITAMIN D (ERGOCALCIFEROL) 1.25 MG (50000 UNIT) PO CAPS: 50000.0000 [IU] | ORAL_CAPSULE | ORAL | 1 refills | Status: DC

## 2023-09-08 ENCOUNTER — Encounter: Payer: Self-pay | Admitting: Family Medicine

## 2023-09-09 ENCOUNTER — Other Ambulatory Visit: Payer: Self-pay

## 2023-09-09 DIAGNOSIS — E559 Vitamin D deficiency, unspecified: Secondary | ICD-10-CM

## 2023-09-09 MED ORDER — VITAMIN D (ERGOCALCIFEROL) 1.25 MG (50000 UNIT) PO CAPS: 50000.0000 [IU] | ORAL_CAPSULE | ORAL | 1 refills | Status: DC

## 2023-09-09 NOTE — Telephone Encounter (Signed)
 TY

## 2023-11-04 ENCOUNTER — Encounter: Payer: Self-pay | Admitting: Gastroenterology

## 2023-11-04 ENCOUNTER — Ambulatory Visit (INDEPENDENT_AMBULATORY_CARE_PROVIDER_SITE_OTHER): Admitting: Gastroenterology

## 2023-11-04 VITALS — BP 121/76 | HR 78 | Temp 98.1°F | Ht 70.0 in | Wt 218.6 lb

## 2023-11-04 DIAGNOSIS — F1290 Cannabis use, unspecified, uncomplicated: Secondary | ICD-10-CM | POA: Diagnosis not present

## 2023-11-04 DIAGNOSIS — R933 Abnormal findings on diagnostic imaging of other parts of digestive tract: Secondary | ICD-10-CM

## 2023-11-04 DIAGNOSIS — R112 Nausea with vomiting, unspecified: Secondary | ICD-10-CM | POA: Diagnosis not present

## 2023-11-04 DIAGNOSIS — R1116 Cannabis hyperemesis syndrome: Secondary | ICD-10-CM

## 2023-11-04 DIAGNOSIS — K219 Gastro-esophageal reflux disease without esophagitis: Secondary | ICD-10-CM

## 2023-11-04 MED ORDER — PANTOPRAZOLE SODIUM 40 MG PO TBEC: 40.0000 mg | DELAYED_RELEASE_TABLET | Freq: Every day | ORAL | 3 refills | Status: AC

## 2023-11-04 NOTE — Patient Instructions (Signed)
 Continue pantoprazole 40mg  daily before breakfast for at least 8 weeks for possible gastritis/ulcers/GERD. If you have flare of symptoms after stopping pantoprazole, you can continue for longer.   Recommend cutting back on all alcohol use altogether for now until you are feeling better. Alcohol can cause gastritis (inflamed stomach).  Remember to limit alcohol use to no more than 2 servings a day in the future.  Recommend quitting marijuana use, this is likely the cause of your recurrent vomiting. Typically it will take 3-4 weeks for symptoms to resolve after your stop marijuana.    Update labs at your convenience to further evaluation your nausea/vomiting.
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
## 2023-11-06 LAB — CBC WITH DIFFERENTIAL/PLATELET
Basophils Absolute: 0 10*3/uL (ref 0.0–0.2)
Basos: 0 %
EOS (ABSOLUTE): 0.1 10*3/uL (ref 0.0–0.4)
Eos: 1 %
Hematocrit: 42.4 % (ref 37.5–51.0)
Hemoglobin: 14.3 g/dL (ref 13.0–17.7)
Immature Grans (Abs): 0 10*3/uL (ref 0.0–0.1)
Immature Granulocytes: 0 %
Lymphocytes Absolute: 2.9 10*3/uL (ref 0.7–3.1)
Lymphs: 46 %
MCH: 27.6 pg (ref 26.6–33.0)
MCHC: 33.7 g/dL (ref 31.5–35.7)
MCV: 82 fL (ref 79–97)
Monocytes Absolute: 0.5 10*3/uL (ref 0.1–0.9)
Monocytes: 9 %
Neutrophils Absolute: 2.8 10*3/uL (ref 1.4–7.0)
Neutrophils: 44 %
Platelets: 264 10*3/uL (ref 150–450)
RBC: 5.18 x10E6/uL (ref 4.14–5.80)
RDW: 13.3 % (ref 11.6–15.4)
WBC: 6.4 10*3/uL (ref 3.4–10.8)

## 2023-11-06 LAB — COMPREHENSIVE METABOLIC PANEL
ALT: 20 IU/L (ref 0–44)
AST: 16 IU/L (ref 0–40)
Albumin: 4.3 g/dL (ref 4.1–5.1)
Alkaline Phosphatase: 67 IU/L (ref 44–121)
BUN/Creatinine Ratio: 10 (ref 9–20)
BUN: 12 mg/dL (ref 6–20)
Bilirubin Total: 0.2 mg/dL (ref 0.0–1.2)
CO2: 23 mmol/L (ref 20–29)
Calcium: 9.5 mg/dL (ref 8.7–10.2)
Chloride: 102 mmol/L (ref 96–106)
Creatinine, Ser: 1.19 mg/dL (ref 0.76–1.27)
Globulin, Total: 2.3 g/dL (ref 1.5–4.5)
Glucose: 100 mg/dL — ABNORMAL HIGH (ref 70–99)
Potassium: 4.4 mmol/L (ref 3.5–5.2)
Sodium: 139 mmol/L (ref 134–144)
Total Protein: 6.6 g/dL (ref 6.0–8.5)
eGFR: 84 mL/min/{1.73_m2} (ref >59)

## 2023-11-06 LAB — LIPASE: Lipase: 440 U/L — ABNORMAL HIGH (ref 13–78)

## 2023-11-14 ENCOUNTER — Other Ambulatory Visit: Payer: Self-pay | Admitting: *Deleted

## 2023-11-14 ENCOUNTER — Observation Stay (HOSPITAL_COMMUNITY)
Admission: EM | Admit: 2023-11-14 | Discharge: 2023-11-15 | Disposition: A | Discharge status: Home / Self Care | Attending: Internal Medicine | Admitting: Internal Medicine

## 2023-11-14 ENCOUNTER — Other Ambulatory Visit: Payer: Self-pay

## 2023-11-14 ENCOUNTER — Encounter (HOSPITAL_COMMUNITY): Payer: Self-pay | Admitting: *Deleted

## 2023-11-14 DIAGNOSIS — N179 Acute kidney failure, unspecified: Secondary | ICD-10-CM | POA: Diagnosis present

## 2023-11-14 DIAGNOSIS — M6282 Rhabdomyolysis: Secondary | ICD-10-CM | POA: Diagnosis not present

## 2023-11-14 DIAGNOSIS — F1290 Cannabis use, unspecified, uncomplicated: Secondary | ICD-10-CM | POA: Diagnosis present

## 2023-11-14 DIAGNOSIS — R1110 Vomiting, unspecified: Secondary | ICD-10-CM | POA: Diagnosis present

## 2023-11-14 DIAGNOSIS — R748 Abnormal levels of other serum enzymes: Secondary | ICD-10-CM

## 2023-11-14 DIAGNOSIS — K219 Gastro-esophageal reflux disease without esophagitis: Secondary | ICD-10-CM | POA: Diagnosis present

## 2023-11-14 DIAGNOSIS — R112 Nausea with vomiting, unspecified: Secondary | ICD-10-CM | POA: Diagnosis present

## 2023-11-14 DIAGNOSIS — F12188 Cannabis abuse with other cannabis-induced disorder: Secondary | ICD-10-CM | POA: Insufficient documentation

## 2023-11-14 LAB — CBC
HCT: 43.7 % (ref 39.0–52.0)
Hemoglobin: 14.9 g/dL (ref 13.0–17.0)
MCH: 27.3 pg (ref 26.0–34.0)
MCHC: 34.1 g/dL (ref 30.0–36.0)
MCV: 80.2 fL (ref 80.0–100.0)
Platelets: 262 10*3/uL (ref 150–400)
RBC: 5.45 MIL/uL (ref 4.22–5.81)
RDW: 14.2 % (ref 11.5–15.5)
WBC: 8.7 10*3/uL (ref 4.0–10.5)
nRBC: 0 % (ref 0.0–0.2)

## 2023-11-14 LAB — COMPREHENSIVE METABOLIC PANEL
ALT: 60 U/L — ABNORMAL HIGH (ref 0–44)
AST: 113 U/L — ABNORMAL HIGH (ref 15–41)
Albumin: 4.6 g/dL (ref 3.5–5.0)
Alkaline Phosphatase: 46 U/L (ref 38–126)
Anion gap: 17 — ABNORMAL HIGH (ref 5–15)
BUN: 18 mg/dL (ref 6–20)
CO2: 16 mmol/L — ABNORMAL LOW (ref 22–32)
Calcium: 10.5 mg/dL — ABNORMAL HIGH (ref 8.9–10.3)
Chloride: 102 mmol/L (ref 98–111)
Creatinine, Ser: 1.46 mg/dL — ABNORMAL HIGH (ref 0.61–1.24)
GFR, Estimated: >60 mL/min (ref >60)
Glucose, Bld: 112 mg/dL — ABNORMAL HIGH (ref 70–99)
Potassium: 3.6 mmol/L (ref 3.5–5.1)
Sodium: 135 mmol/L (ref 135–145)
Total Bilirubin: 1.4 mg/dL — ABNORMAL HIGH (ref 0.0–1.2)
Total Protein: 7.8 g/dL (ref 6.5–8.1)

## 2023-11-14 LAB — LIPASE, BLOOD: Lipase: 27 U/L (ref 11–51)

## 2023-11-14 LAB — CK: Total CK: 5153 U/L — ABNORMAL HIGH (ref 49–397)

## 2023-11-14 MED ORDER — ONDANSETRON HCL 4 MG/2ML IJ SOLN
4.0000 mg | Freq: Four times a day (QID) | INTRAMUSCULAR | Status: DC | PRN
  Administered 2023-11-14: 4 mg via INTRAVENOUS
  Filled 2023-11-14: qty 2

## 2023-11-14 MED ORDER — LACTATED RINGERS IV BOLUS
1000.0000 mL | Freq: Once | INTRAVENOUS | Status: AC
  Administered 2023-11-14: 1000 mL via INTRAVENOUS

## 2023-11-14 MED ORDER — LACTATED RINGERS IV BOLUS
2000.0000 mL | Freq: Once | INTRAVENOUS | Status: AC
  Administered 2023-11-14: 2000 mL via INTRAVENOUS

## 2023-11-14 MED ORDER — PANTOPRAZOLE SODIUM 40 MG PO TBEC
40.0000 mg | DELAYED_RELEASE_TABLET | Freq: Every day | ORAL | Status: DC
Start: 2023-11-15 — End: 2023-11-15
  Administered 2023-11-15: 40 mg via ORAL
  Filled 2023-11-14: qty 1

## 2023-11-14 MED ORDER — ONDANSETRON HCL 4 MG PO TABS: 4.0000 mg | ORAL_TABLET | Freq: Four times a day (QID) | ORAL | 1 refills | Status: DC | PRN

## 2023-11-14 MED ORDER — CAPSAICIN 0.025 % EX CREA
TOPICAL_CREAM | Freq: Once | CUTANEOUS | Status: AC
  Filled 2023-11-14: qty 60

## 2023-11-14 MED ORDER — ENOXAPARIN SODIUM 40 MG/0.4ML IJ SOSY
40.0000 mg | PREFILLED_SYRINGE | INTRAMUSCULAR | Status: DC
  Administered 2023-11-15: 40 mg via SUBCUTANEOUS
  Filled 2023-11-14: qty 0.4

## 2023-11-14 MED ORDER — FAMOTIDINE IN NACL 20-0.9 MG/50ML-% IV SOLN
20.0000 mg | Freq: Once | INTRAVENOUS | Status: DC
  Filled 2023-11-14: qty 50

## 2023-11-14 MED ORDER — LACTATED RINGERS IV BOLUS: 1000.0000 mL | Freq: Once | INTRAVENOUS | Status: DC

## 2023-11-14 MED ORDER — DROPERIDOL 2.5 MG/ML IJ SOLN
1.2500 mg | Freq: Once | INTRAMUSCULAR | Status: AC
  Administered 2023-11-14: 1.25 mg via INTRAVENOUS
  Filled 2023-11-14: qty 2

## 2023-11-14 MED ORDER — HALOPERIDOL LACTATE 5 MG/ML IJ SOLN: 2.5000 mg | Freq: Four times a day (QID) | INTRAMUSCULAR | Status: DC | PRN

## 2023-11-14 MED ORDER — LACTATED RINGERS IV SOLN: INTRAVENOUS | Status: DC

## 2023-11-14 NOTE — H&P (Signed)
 History and Physical    Patient: Kenneth Chang QMV:[id number] DOB: Jan 15, 1991 DOA: 11/14/2023 DOS: the patient was seen and examined on 11/14/2023 PCP: Gilmore Laroche, FNP  Patient coming from: Home  Chief Complaint:  Chief Complaint  Patient presents with   Emesis   HPI: Kenneth Chang is a 33 y.o. male with medical history significant of marijuana use, GERD, hyperlipidemia, prediabetes.  Patient comes in with nausea, vomiting, abdominal pain.  Prior to a week ago, he was a heavy marijuana smoker, however he has not had any in the last week.  Due to his non-smoking, he has had significant abdominal pain, nausea and vomiting.  He gets some relief with taking hot showers.  When he runs out of hot water, he starts doing push-ups and has been doing a lot of push-ups in order to get symptomatic relief.  Due to the continued symptoms, he came to the hospital for evaluation.  Labs show an elevated creatinine at 1.4 (baseline is normal), elevated bilirubin at 1.4 and an anion gap metabolic acidosis.  The EDP checked a creatinine kinase which was elevated at 5153.  Review of Systems: As mentioned in the history of present illness. All other systems reviewed and are negative. Past Medical History:  Diagnosis Date   Prediabetes    Vitamin D deficiency    Past Surgical History:  Procedure Laterality Date   BIOPSY  12/27/2022   Procedure: BIOPSY;  Surgeon: Lanelle Bal, DO;  [facility];  Service: Endoscopy;;   ESOPHAGOGASTRODUODENOSCOPY (EGD) WITH PROPOFOL N/A 12/27/2022   Procedure: ESOPHAGOGASTRODUODENOSCOPY (EGD) WITH PROPOFOL;  Surgeon: Lanelle Bal, DO;  Location: [facility];  Service: Endoscopy;  Laterality: N/A;  12:30 pm   HERNIA REPAIR     Social History:  reports that he has been smoking cigars. He has been exposed to tobacco smoke. He has never used smokeless tobacco. He reports that he does not currently use alcohol. He reports that he does not currently use  drugs after having used the following drugs: Marijuana.  No Known Allergies  Family History  Problem Relation Age of Onset   Prostate cancer Maternal Grandfather    Colon cancer Neg Hx    Crohn's disease Neg Hx     Prior to Admission medications   Medication Sig Start Date End Date Taking? Authorizing Provider  ibuprofen (ADVIL) 200 MG tablet Take 800 mg by mouth every 6 (six) hours as needed for moderate pain.    [provider]  ondansetron (ZOFRAN) 4 MG tablet Take 1 tablet (4 mg total) by mouth every 6 (six) hours as needed for nausea or vomiting. 11/14/23   Tiffany Kocher, PA-C  pantoprazole (PROTONIX) 40 MG tablet Take 1 tablet (40 mg total) by mouth daily before breakfast. 11/04/23   Tiffany Kocher, PA-C  Vitamin D, Ergocalciferol, (DRISDOL) 1.25 MG (50000 UNIT) CAPS capsule Take 1 capsule (50,000 Units total) by mouth every 7 (seven) days. 09/09/23   Gilmore Laroche, FNP    Physical Exam: Vitals:   11/14/23 1151 11/14/23 1154  BP:  (!) 172/90  Pulse:  63  Resp:  20  Temp:  97.6 F (36.4 C)  TempSrc:  Oral  SpO2:  100%  Weight: 96.2 kg   Height: 5\' 9"  (1.753 m)    General: Young male. Awake and alert and oriented x3. No acute cardiopulmonary distress.  HEENT: Normocephalic atraumatic.  Right and left ears normal in appearance.  Pupils equal, round, reactive to light. Extraocular muscles  are intact. Sclerae anicteric and noninjected.  Moist mucosal membranes. No mucosal lesions.  Neck: Neck supple without lymphadenopathy. No carotid bruits. No masses palpated.  Cardiovascular: Regular rate with normal S1-S2 sounds. No murmurs, rubs, gallops auscultated. No JVD.  Respiratory: Good respiratory effort with no wheezes, rales, rhonchi. Lungs clear to auscultation bilaterally.  No accessory muscle use. Abdomen: Soft, nontender, nondistended. Active bowel sounds. No masses or hepatosplenomegaly  Skin: No rashes, lesions, or ulcerations.  Dry, warm to touch. 2+ dorsalis  pedis and radial pulses. Musculoskeletal: No calf or leg pain. All major joints not erythematous nontender.  No upper or lower joint deformation.  Good ROM.  No contractures  Psychiatric: Intact judgment and insight. Pleasant and cooperative. Neurologic: No focal neurological deficits. Strength is 5/5 and symmetric in upper and lower extremities.  Cranial nerves II through XII are grossly intact.   Data Reviewed: Results for orders placed or performed during the hospital encounter of 11/14/23 (from the past 24 hours)  Lipase, blood     Status: None   Collection Time: 11/14/23 12:10 PM  Result Value Ref Range   Lipase 27 11 - 51 U/L  Comprehensive metabolic panel     Status: Abnormal   Collection Time: 11/14/23 12:10 PM  Result Value Ref Range   Sodium 135 135 - 145 mmol/L   Potassium 3.6 3.5 - 5.1 mmol/L   Chloride 102 98 - 111 mmol/L   CO2 16 (L) 22 - 32 mmol/L   Glucose, Bld 112 (H) 70 - 99 mg/dL   BUN 18 6 - 20 mg/dL   Creatinine, Ser 4.16 (H) 0.61 - 1.24 mg/dL   Calcium 60.6 (H) 8.9 - 10.3 mg/dL   Total Protein 7.8 6.5 - 8.1 g/dL   Albumin 4.6 3.5 - 5.0 g/dL   AST 301 (H) 15 - 41 U/L   ALT 60 (H) 0 - 44 U/L   Alkaline Phosphatase 46 38 - 126 U/L   Total Bilirubin 1.4 (H) 0.0 - 1.2 mg/dL   GFR, Estimated >60 >10 mL/min   Anion gap 17 (H) 5 - 15  CBC     Status: None   Collection Time: 11/14/23 12:10 PM  Result Value Ref Range   WBC 8.7 4.0 - 10.5 K/uL   RBC 5.45 4.22 - 5.81 MIL/uL   Hemoglobin 14.9 13.0 - 17.0 g/dL   HCT 93.2 35.5 - 73.2 %   MCV 80.2 80.0 - 100.0 fL   MCH 27.3 26.0 - 34.0 pg   MCHC 34.1 30.0 - 36.0 g/dL   RDW 20.2 54.2 - 70.6 %   Platelets 262 150 - 400 K/uL   nRBC 0.0 0.0 - 0.2 %  CK     Status: Abnormal   Collection Time: 11/14/23  3:54 PM  Result Value Ref Range   Total CK 5,153 (H) 49 - 397 U/L    No results found.   Assessment and Plan: No notes have been filed under this hospital service. Service: Hospitalist  Principal Problem:    Rhabdomyolysis Active Problems:   AKI (acute kidney injury) (HCC)   Cannabinoid hyperemesis syndrome   GERD (gastroesophageal reflux disease)   Rhabdomyolysis Admit IVF boluses, continuous after bolus Check BMP and CK tonight and tomorrow AM AKI Recheck in AM Metabolic acidosis Check BMP tonight to ensure improvement Cannabinoid hyperemesis syndrome Haldol, zofran GERD    Advance Care Planning:   Code Status: Full Code   Consults: none  Family Communication:   Severity of Illness:  The appropriate patient status for this patient is INPATIENT. Inpatient status is judged to be reasonable and necessary in order to provide the required intensity of service to ensure the patient's safety. The patient's presenting symptoms, physical exam findings, and initial radiographic and laboratory data in the context of their chronic comorbidities is felt to place them at high risk for further clinical deterioration. Furthermore, it is not anticipated that the patient will be medically stable for discharge from the hospital within 2 midnights of admission.   * I certify that at the point of admission it is my clinical judgment that the patient will require inpatient hospital care spanning beyond 2 midnights from the point of admission due to high intensity of service, high risk for further deterioration and high frequency of surveillance required.*  Author: Levie Heritage, DO 11/14/2023 7:12 PM  For on call review [email or web address].

## 2023-11-14 NOTE — ED Provider Notes (Signed)
 [place] EMERGENCY DEPARTMENT AT [facility] Provider Note   CSN: [id number] Arrival date & time: 11/14/23  1147     History  Chief Complaint  Patient presents with   Emesis    Nivin Braniff is a 33 y.o. male. The ER today complaining of abdominal pain nausea and vomiting related to cannabis hyperemesis syndrome.  He has been in the ER for this in the past and has been following with GI.  He had a recent visit.  States he has not smoked marijuana a week but is still unfortunately having the symptoms.  Feels exactly similar to prior episodes.  He does get some relief with hot showers.  He takes medication for gastritis as well.  Denies any history of other chronic medical conditions.  Emesis      Home Medications Prior to Admission medications   Medication Sig Start Date End Date Taking? Authorizing Provider  ibuprofen (ADVIL) 200 MG tablet Take 800 mg by mouth every 6 (six) hours as needed for moderate pain.    [provider]  ondansetron (ZOFRAN) 4 MG tablet Take 1 tablet (4 mg total) by mouth every 6 (six) hours as needed for nausea or vomiting. 11/14/23   Tiffany Kocher, PA-C  pantoprazole (PROTONIX) 40 MG tablet Take 1 tablet (40 mg total) by mouth daily before breakfast. 11/04/23   Tiffany Kocher, PA-C  Vitamin D, Ergocalciferol, (DRISDOL) 1.25 MG (50000 UNIT) CAPS capsule Take 1 capsule (50,000 Units total) by mouth every 7 (seven) days. 09/09/23   Gilmore Laroche, FNP      Allergies    Patient has no known allergies.    Review of Systems   Review of Systems  Gastrointestinal:  Positive for vomiting.    Physical Exam Updated Vital Signs BP (!) 172/90 (BP Location: Right Arm)   Pulse 63   Temp 97.6 F (36.4 C) (Oral)   Resp 20   Ht 5\' 9"  (1.753 m)   Wt 96.2 kg   SpO2 100%   BMI 31.31 kg/m  Physical Exam Vitals and nursing note reviewed.  Constitutional:      General: He is not in acute distress.    Appearance: He is well-developed.   HENT:     Head: Normocephalic and atraumatic.  Eyes:     Conjunctiva/sclera: Conjunctivae normal.  Cardiovascular:     Rate and Rhythm: Normal rate and regular rhythm.     Heart sounds: No murmur heard. Pulmonary:     Effort: Pulmonary effort is normal. No respiratory distress.     Breath sounds: Normal breath sounds.  Abdominal:     Palpations: Abdomen is soft.     Tenderness: There is no abdominal tenderness.  Musculoskeletal:        General: No swelling.     Cervical back: Neck supple.  Skin:    General: Skin is warm and dry.     Capillary Refill: Capillary refill takes less than 2 seconds.  Neurological:     General: No focal deficit present.     Mental Status: He is alert and oriented to person, place, and time.  Psychiatric:        Mood and Affect: Mood normal.     ED Results / Procedures / Treatments   Labs (all labs ordered are listed, but only abnormal results are displayed) Labs Reviewed  COMPREHENSIVE METABOLIC PANEL - Abnormal; Notable for the following components:      Result Value   CO2 16 (*)  Glucose, Bld 112 (*)    Creatinine, Ser 1.46 (*)    Calcium 10.5 (*)    AST 113 (*)    ALT 60 (*)    Total Bilirubin 1.4 (*)    Anion gap 17 (*)    All other components within normal limits  CK - Abnormal; Notable for the following components:   Total CK 5,153 (*)    All other components within normal limits  LIPASE, BLOOD  CBC  HEPATITIS PANEL, ACUTE  URINALYSIS, ROUTINE W REFLEX MICROSCOPIC    EKG None  Radiology No results found.  Procedures Procedures    Medications Ordered in ED Medications  famotidine (PEPCID) IVPB 20 mg premix (20 mg Intravenous Incomplete 11/14/23 1334)  droperidol (INAPSINE) 2.5 MG/ML injection 1.25 mg (1.25 mg Intravenous Incomplete 11/14/23 1333)  lactated ringers bolus 2,000 mL (has no administration in time range)  lactated ringers bolus 1,000 mL (0 mLs Intravenous Stopped 11/14/23 1436)  capsaicin (ZOSTRIX) 0.025 %  cream ( Topical Given 11/14/23 1405)  lactated ringers bolus 1,000 mL (0 mLs Intravenous Stopped 11/14/23 1819)    ED Course/ Medical Decision Making/ A&P                                 Medical Decision Making Diagnosis includes but not limited to cannabinoid hyperemesis, dehydration, AKI, rhabdomyolysis, other  ED course: Patient presents the ER today for evaluation of continuous nausea vomiting, has known cannabinoid hyperemesis syndrome and is following with GI, is also on PPI for gastritis.  He is undergoing further outpatient workup and has outpatient ultrasound scheduled.  Patient has been doing a lot of push-ups because he states this helps his symptoms..  Labs showed elevation in his creatinine, mild elevation of bilirubin and ALT and AST.  He also had mild hypercalcemia.  Also noted to have CO2 16 and anion gap of 17.  Given IV fluids but I added CK on to labs in light of the elevated LFTs as well as a creatinine, in the setting of him doing excessive amounts of push-ups, including doing push-ups in the hallway in the ED here.  CK result came back at 5153.  Additional IV fluids ordered I will plan to admit for rhabdomyolysis.  Discussed with hospitalist Dr. Adrian Blackwater who is agreeable.  Was given IV fluids as well as droperidol and capsaicin cream with mild improvement.  On recheck at this time, patient is laying in bed in no distress.  Amount and/or Complexity of Data Reviewed Labs: ordered.  Risk OTC drugs. Prescription drug management. Decision regarding hospitalization.           Final Clinical Impression(s) / ED Diagnoses Final diagnoses:  Non-traumatic rhabdomyolysis    Rx / DC Orders ED Discharge Orders     None         Josem Kaufmann 11/14/23 1905    Terrilee Files, MD 11/15/23 [id number]

## 2023-11-14 NOTE — ED Notes (Signed)
Attempted to call report x 1-no answer.

## 2023-11-14 NOTE — ED Triage Notes (Signed)
 Pt with N/V x 3 weeks, has seen GI and prescribed n/v medication today.

## 2023-11-14 NOTE — ED Notes (Signed)
 Pt doing push-ups in hallway. States it helps with the nausea

## 2023-11-14 NOTE — Progress Notes (Signed)
   11/14/23 1927  TOC Brief Assessment  Insurance and Status Reviewed  Patient has primary care physician Yes  Home environment has been reviewed From home.  Prior level of function: Independent/  Prior/Current Home Services No current home services  Social Drivers of Health Review SDOH reviewed interventions complete (Smoking cessation added to AVS.)  Transition of care needs no transition of care needs at this time   Transition of Care Department [facility]) has reviewed patient and no other TOC needs have been identified at this time. We will continue to monitor patient advancement through interdisciplinary progression rounds. If new patient transition needs arise, please place a TOC consult.

## 2023-11-14 NOTE — ED Notes (Signed)
 ED TO INPATIENT HANDOFF REPORT  ED Nurse Name and Phone #:   S Name/Age/Gender Kenneth Chang 33 y.o. male Room/Bed: APAH6/APAH6  Code Status   Code Status: Full Code  Home/SNF/Other Home Patient oriented to: self, place, time, and situation Is this baseline? Yes   Triage Complete: Triage complete  Chief Complaint Rhabdomyolysis [M62.82]  Triage Note Pt with N/V x 3 weeks, has seen GI and prescribed n/v medication today.     Allergies No Known Allergies  Level of Care/Admitting Diagnosis ED Disposition     ED Disposition  Admit   Condition  --   Comment  Hospital Area: [facility] [[id number]]  Level of Care: Med-Surg [16]  Covid Evaluation: Asymptomatic - no recent exposure (last 10 days) testing not required  Diagnosis: Rhabdomyolysis [728.88.ICD-9-CM]  Admitting Physician: Levie Heritage [[id number]]  Attending Physician: Levie Heritage [[id number]]  Certification:: I certify this patient will need inpatient services for at least 2 midnights  Expected Medical Readiness: 11/17/2023          B Medical/Surgery History Past Medical History:  Diagnosis Date   Prediabetes    Vitamin D deficiency    Past Surgical History:  Procedure Laterality Date   BIOPSY  12/27/2022   Procedure: BIOPSY;  Surgeon: Lanelle Bal, DO;  [facility];  Service: Endoscopy;;   ESOPHAGOGASTRODUODENOSCOPY (EGD) WITH PROPOFOL N/A 12/27/2022   Procedure: ESOPHAGOGASTRODUODENOSCOPY (EGD) WITH PROPOFOL;  Surgeon: Lanelle Bal, DO;  [facility];  Service: Endoscopy;  Laterality: N/A;  12:30 pm   HERNIA REPAIR       A IV Location/Drains/Wounds Patient Lines/Drains/Airways Status     Active Line/Drains/Airways     Name Placement date Placement time Site Days   Peripheral IV 11/14/23 20 G Anterior;Distal;Right;Upper Arm 11/14/23  1340  Arm  less than 1            Intake/Output Last 24 hours No intake or output data in the 24 hours ending  11/14/23 2012  Labs/Imaging Results for orders placed or performed during the hospital encounter of 11/14/23 (from the past 48 hours)  Lipase, blood     Status: None   Collection Time: 11/14/23 12:10 PM  Result Value Ref Range   Lipase 27 11 - 51 U/L    Comment: Performed at [facility], [address] [place], [place] [address]  Comprehensive metabolic panel     Status: Abnormal   Collection Time: 11/14/23 12:10 PM  Result Value Ref Range   Sodium 135 135 - 145 mmol/L   Potassium 3.6 3.5 - 5.1 mmol/L   Chloride 102 98 - 111 mmol/L   CO2 16 (L) 22 - 32 mmol/L   Glucose, Bld 112 (H) 70 - 99 mg/dL    Comment: Glucose reference range applies only to samples taken after fasting for at least 8 hours.   BUN 18 6 - 20 mg/dL   Creatinine, Ser 6.04 (H) 0.61 - 1.24 mg/dL   Calcium 54.0 (H) 8.9 - 10.3 mg/dL   Total Protein 7.8 6.5 - 8.1 g/dL   Albumin 4.6 3.5 - 5.0 g/dL   AST 981 (H) 15 - 41 U/L   ALT 60 (H) 0 - 44 U/L   Alkaline Phosphatase 46 38 - 126 U/L   Total Bilirubin 1.4 (H) 0.0 - 1.2 mg/dL   GFR, Estimated >19 >14 mL/min    Comment: (NOTE) Calculated using the CKD-EPI Creatinine Equation (2021)    Anion gap 17 (H) 5 -  15    Comment: Performed at [facility], [address] [place], [place] [address]  CBC     Status: None   Collection Time: 11/14/23 12:10 PM  Result Value Ref Range   WBC 8.7 4.0 - 10.5 K/uL   RBC 5.45 4.22 - 5.81 MIL/uL   Hemoglobin 14.9 13.0 - 17.0 g/dL   HCT 95.2 84.1 - 32.4 %   MCV 80.2 80.0 - 100.0 fL   MCH 27.3 26.0 - 34.0 pg   MCHC 34.1 30.0 - 36.0 g/dL   RDW 40.1 02.7 - 25.3 %   Platelets 262 150 - 400 K/uL   nRBC 0.0 0.0 - 0.2 %    Comment: Performed at [facility], [address] [place], [place] [address]  CK     Status: Abnormal   Collection Time: 11/14/23  3:54 PM  Result Value Ref Range   Total CK 5,153 (H) 49 - 397 U/L    Comment: RESULTS CONFIRMED BY MANUAL DILUTION Performed at [facility], [address], [place], [place]  [address]    No results found.  Pending Labs [employer] (From admission, onward)     Start     Ordered   11/14/23 1859  Urinalysis, Routine w reflex microscopic -Urine, Clean Catch  Once,   URGENT       Question:  Specimen Source  Answer:  Urine, Clean Catch   11/14/23 1858   11/14/23 1507  Hepatitis panel, acute  Once,   URGENT        11/14/23 1506   Signed and Held  HIV Antibody (routine testing w rflx)  (HIV Antibody (Routine testing w reflex) panel)  Once,   R        Signed and Held   Signed and Held  Basic metabolic panel  Tomorrow morning,   R        Signed and Held   Signed and Held  CK  Once,   R        Signed and Held   Signed and Held  CK  Tomorrow morning,   R        Signed and Held   Signed and Held  Basic metabolic panel  Once,   R        Signed and Held            Vitals/Pain Today's Vitals   11/14/23 1151 11/14/23 1154  BP:  (!) 172/90  Pulse:  63  Resp:  20  Temp:  97.6 F (36.4 C)  TempSrc:  Oral  SpO2:  100%  Weight: 96.2 kg   Height: 5\' 9"  (1.753 m)   PainSc: 0-No pain     Isolation Precautions No active isolations  Medications Medications  famotidine (PEPCID) IVPB 20 mg premix (20 mg Intravenous Incomplete 11/14/23 1334)  droperidol (INAPSINE) 2.5 MG/ML injection 1.25 mg (1.25 mg Intravenous Incomplete 11/14/23 1333)  haloperidol lactate (HALDOL) injection 2.5 mg (has no administration in time range)  ondansetron (ZOFRAN) injection 4 mg (has no administration in time range)  lactated ringers bolus 1,000 mL (0 mLs Intravenous Stopped 11/14/23 1436)  capsaicin (ZOSTRIX) 0.025 % cream ( Topical Given 11/14/23 1405)  lactated ringers bolus 1,000 mL (0 mLs Intravenous Stopped 11/14/23 1819)  lactated ringers bolus 2,000 mL (2,000 mLs Intravenous New Bag/Given 11/14/23 2011)    Mobility walks     Focused Assessments     R Recommendations: See Admitting Provider Note  Report given to:   Additional  Notes:

## 2023-11-15 DIAGNOSIS — M6282 Rhabdomyolysis: Principal | ICD-10-CM

## 2023-11-15 DIAGNOSIS — F1290 Cannabis use, unspecified, uncomplicated: Secondary | ICD-10-CM

## 2023-11-15 DIAGNOSIS — R112 Nausea with vomiting, unspecified: Secondary | ICD-10-CM

## 2023-11-15 DIAGNOSIS — N179 Acute kidney failure, unspecified: Secondary | ICD-10-CM | POA: Diagnosis not present

## 2023-11-15 LAB — HEPATITIS PANEL, ACUTE
HCV Ab: NONREACTIVE
Hep A IgM: NONREACTIVE
Hep B C IgM: NONREACTIVE
Hepatitis B Surface Ag: NONREACTIVE

## 2023-11-15 LAB — BASIC METABOLIC PANEL
Anion gap: 14 (ref 5–15)
Anion gap: 8 (ref 5–15)
BUN: 14 mg/dL (ref 6–20)
BUN: 12 mg/dL (ref 6–20)
CO2: 20 mmol/L — ABNORMAL LOW (ref 22–32)
CO2: 22 mmol/L (ref 22–32)
Calcium: 9.4 mg/dL (ref 8.9–10.3)
Calcium: 9.2 mg/dL (ref 8.9–10.3)
Chloride: 101 mmol/L (ref 98–111)
Chloride: 105 mmol/L (ref 98–111)
Creatinine, Ser: 1.32 mg/dL — ABNORMAL HIGH (ref 0.61–1.24)
Creatinine, Ser: 1.09 mg/dL (ref 0.61–1.24)
GFR, Estimated: >60 mL/min (ref >60)
GFR, Estimated: >60 mL/min (ref >60)
Glucose, Bld: 96 mg/dL (ref 70–99)
Glucose, Bld: 83 mg/dL (ref 70–99)
Potassium: 3.7 mmol/L (ref 3.5–5.1)
Potassium: 3.7 mmol/L (ref 3.5–5.1)
Sodium: 135 mmol/L (ref 135–145)
Sodium: 135 mmol/L (ref 135–145)

## 2023-11-15 LAB — CK
Total CK: 5787 U/L — ABNORMAL HIGH (ref 49–397)
Total CK: 4540 U/L — ABNORMAL HIGH (ref 49–397)

## 2023-11-15 LAB — HIV ANTIBODY (ROUTINE TESTING W REFLEX): HIV Screen 4th Generation wRfx: NONREACTIVE

## 2023-11-15 MED ORDER — ONDANSETRON HCL 4 MG PO TABS: 4.0000 mg | ORAL_TABLET | Freq: Four times a day (QID) | ORAL | 1 refills | Status: AC | PRN

## 2023-11-15 NOTE — Plan of Care (Signed)

## 2023-11-15 NOTE — Discharge Summary (Signed)
 Physician Discharge Summary   Patient: Kenneth Chang MRN: [id number] DOB: [birth year]  Admit date:     11/14/2023  Discharge date: 11/15/23  Discharge Physician: Deanna Artis   PCP: Gilmore Laroche, FNP   Recommendations at discharge:   At this time patient will be discharged home.  If you experience any symptoms such as fever, vomiting, shortness of breath, chest pain, abdominal pain, or other concerning symptoms, please call your primary care provider or go to the emergency department immediately.  Discharge Diagnoses: Principal Problem:   Rhabdomyolysis Active Problems:   AKI (acute kidney injury) (HCC)   Cannabinoid hyperemesis syndrome   GERD (gastroesophageal reflux disease)  Resolved Problems:   * No resolved hospital problems. *  Hospital Course: Kenneth Chang is a 33 y.o. male with medical history significant of marijuana use, GERD, hyperlipidemia, prediabetes.  Patient comes in with nausea, vomiting, abdominal pain.  Prior to a week ago, he was a heavy marijuana smoker, however he has not had any in the last week.  Due to his non-smoking, he has had significant abdominal pain, nausea and vomiting.  He gets some relief with taking hot showers.  When he runs out of hot water, he starts doing push-ups and has been doing a lot of push-ups in order to get symptomatic relief.  Due to the continued symptoms, he came to the hospital for evaluation.   Labs show an elevated creatinine at 1.4 (baseline is normal), elevated bilirubin at 1.4 and an anion gap metabolic acidosis.  The EDP checked a creatinine kinase which was elevated at 5153.  Assessment and Plan:  Intractable nausea vomiting secondary to cannabinoid hyperemesis syndrome - Responded well to IV fluid hydration, Haldol, Zofran.  Appears to be resolved, patient tolerating diet, ambulatory, eager for discharge home.  Patient showing a remarkable recovery.  Will give prescription for sublingual Zofran to take as directed.   Encourage weaning marijuana use.  Acute kidney injury secondary to rhabdomyolysis - Likely from intractable nausea vomiting and dehydration.  Acute kidney injury resolved after aggressive IV fluid hydration.  CK showing downtrend.  Encourage continued oral hydration upon discharge.  Recommend follow-up with primary care provider in 2 to 4 weeks.  Metabolic acidosis - Resolved after IV fluids.  GERD - Resume home medication regimen.       Consultants: None Procedures performed: None Disposition: Home Diet recommendation:  Discharge Diet Orders (From admission, onward)     Start     Ordered   11/15/23 0000  Diet - low sodium heart healthy        11/15/23 1157           Cardiac diet DISCHARGE MEDICATION: Allergies as of 11/15/2023   No Known Allergies      Medication List     TAKE these medications    ibuprofen 200 MG tablet Commonly known as: ADVIL Take 800 mg by mouth every 6 (six) hours as needed for moderate pain.   ondansetron 4 MG tablet Commonly known as: ZOFRAN Take 1 tablet (4 mg total) by mouth every 6 (six) hours as needed for nausea or vomiting.   pantoprazole 40 MG tablet Commonly known as: PROTONIX Take 1 tablet (40 mg total) by mouth daily before breakfast.   Vitamin D (Ergocalciferol) 1.25 MG (50000 UNIT) Caps capsule Commonly known as: DRISDOL Take 1 capsule (50,000 Units total) by mouth every 7 (seven) days.        Discharge Exam: Filed Weights   11/14/23 1151 11/14/23 2050  Weight: 96.2 kg 96.9 kg   GENERAL:  Alert, pleasant, no acute distress  HEENT:  EOMI CARDIOVASCULAR:  RRR, no murmurs appreciated RESPIRATORY:  Clear to auscultation, no wheezing, rales, or rhonchi GASTROINTESTINAL:  Soft, nontender, nondistended EXTREMITIES:  No LE edema bilaterally NEURO:  No new focal deficits appreciated SKIN:  No rashes noted PSYCH:  Appropriate mood and affect    Condition at discharge: improving  The results of significant  diagnostics from this hospitalization (including imaging, microbiology, ancillary and laboratory) are listed below for reference.   Imaging Studies: No results found.  Microbiology: Results for orders placed or performed during the hospital encounter of 03/15/20  SARS CORONAVIRUS 2 (TAT 6-24 HRS) Nasopharyngeal Nasopharyngeal Swab     Status: None   Collection Time: 03/15/20 11:39 AM   Specimen: Nasopharyngeal Swab  Result Value Ref Range Status   SARS Coronavirus 2 NEGATIVE NEGATIVE Final    Comment: (NOTE) SARS-CoV-2 target nucleic acids are NOT DETECTED.  The SARS-CoV-2 RNA is generally detectable in upper and lower respiratory specimens during the acute phase of infection. Negative results do not preclude SARS-CoV-2 infection, do not rule out co-infections with other pathogens, and should not be used as the sole basis for treatment or other patient management decisions. Negative results must be combined with clinical observations, patient history, and epidemiological information. The expected result is Negative.  Fact Sheet for Patients: HairSlick.no  Fact Sheet for Healthcare Providers: quierodirigir.com  This test is not yet approved or cleared by the [place] FDA and  has been authorized for detection and/or diagnosis of SARS-CoV-2 by FDA under an Emergency Use Authorization (EUA). This EUA will remain  in effect (meaning this test can be used) for the duration of the COVID-19 declaration under Se ction 564(b)(1) of the Act, 21 U.S.C. section 360bbb-3(b)(1), unless the authorization is terminated or revoked sooner.  Performed at [facility], [address] [place], [place] [address]     Labs: CBC: Recent Labs  Lab 11/14/23 1210  WBC 8.7  HGB 14.9  HCT 43.7  MCV 80.2  PLT 262   Basic Metabolic Panel: Recent Labs  Lab 11/14/23 1210 11/14/23 2345 11/15/23 0527  NA 135 135 135  K 3.6 3.7  3.7  CL 102 101 105  CO2 16* 20* 22  GLUCOSE 112* 96 83  BUN 18 14 12   CREATININE 1.46* 1.32* 1.09  CALCIUM 10.5* 9.4 9.2   Liver Function Tests: Recent Labs  Lab 11/14/23 1210  AST 113*  ALT 60*  ALKPHOS 46  BILITOT 1.4*  PROT 7.8  ALBUMIN 4.6   CBG: No results for input(s): "GLUCAP" in the last 168 hours.  Discharge time spent: less than 30 minutes.  Signed: Deanna Artis, DO [facility] 11/15/2023

## 2023-11-17 ENCOUNTER — Telehealth: Payer: Self-pay

## 2023-11-17 ENCOUNTER — Ambulatory Visit (HOSPITAL_COMMUNITY)
Admission: RE | Admit: 2023-11-17 | Discharge: 2023-11-17 | Disposition: A | Discharge status: Home / Self Care | Source: Ambulatory Visit | Attending: Gastroenterology | Admitting: Gastroenterology

## 2023-11-17 DIAGNOSIS — R748 Abnormal levels of other serum enzymes: Secondary | ICD-10-CM | POA: Insufficient documentation

## 2023-11-17 NOTE — Transitions of Care (Post Inpatient/ED Visit) (Signed)
   11/17/2023  Name: Kenneth Chang MRN: [id number] DOB: 21-Nov-1990  Today's TOC FU Call Status: Today's TOC FU Call Status:: Successful TOC FU Call Completed TOC FU Call Complete Date: 11/17/23 Patient's Name and Date of Birth confirmed.  Transition Care Management Follow-up Telephone Call Date of Discharge: 11/15/23 Discharge Facility: [facility] ([facility] Type of Discharge: Inpatient Admission Primary Inpatient Discharge Diagnosis:: rhabdomyolysis How have you been since you were released from the hospital?: Better Any questions or concerns?: No  Items Reviewed: Did you receive and understand the discharge instructions provided?: Yes Medications obtained,verified, and reconciled?: Yes (Medications Reviewed) Any new allergies since your discharge?: No Dietary orders reviewed?: Yes Do you have support at home?: Yes People in Home: significant other  Medications Reviewed Today: Medications Reviewed Today     Reviewed by Karena Addison, LPN (Licensed Practical Nurse) on 11/17/23 at 1306  Med List Status: <None>   Medication Order Taking? Sig Documenting Provider Last Dose Status Informant  ibuprofen (ADVIL) 200 MG tablet [id number] No Take 800 mg by mouth every 6 (six) hours as needed for moderate pain. [provider] Taking Active Self           Med Note Peggye Pitt, [facility] M   Tue Nov 04, 2023  1:37 PM) As needed   ondansetron (ZOFRAN) 4 MG tablet [id number]  Take 1 tablet (4 mg total) by mouth every 6 (six) hours as needed for nausea or vomiting. Deanna Artis, DO  Active   pantoprazole (PROTONIX) 40 MG tablet [id number]  Take 1 tablet (40 mg total) by mouth daily before breakfast. Tiffany Kocher, PA-C  Active   Vitamin D, Ergocalciferol, (DRISDOL) 1.25 MG (50000 UNIT) CAPS capsule [id number] No Take 1 capsule (50,000 Units total) by mouth every 7 (seven) days. Gilmore Laroche, FNP Taking Active             Home Care and Equipment/Supplies: Were Home Health Services  Ordered?: NA Any new equipment or medical supplies ordered?: NA  Functional Questionnaire: Do you need assistance with bathing/showering or dressing?: No Do you need assistance with meal preparation?: No Do you need assistance with eating?: No Do you have difficulty maintaining continence: No Do you need assistance with getting out of bed/getting out of a chair/moving?: No Do you have difficulty managing or taking your medications?: No  Follow up appointments reviewed: PCP Follow-up appointment confirmed?: Yes Date of PCP follow-up appointment?: 11/27/23 Follow-up Provider: [facility] Follow-up appointment confirmed?: NA Do you need transportation to your follow-up appointment?: No Do you understand care options if your condition(s) worsen?: Yes-patient verbalized understanding    SIGNATURE Karena Addison, LPN [facility] Nurse Health Advisor Direct Dial [phone]

## 2023-11-26 ENCOUNTER — Encounter: Payer: Self-pay | Admitting: *Deleted

## 2023-11-26 ENCOUNTER — Other Ambulatory Visit: Payer: Self-pay | Admitting: *Deleted

## 2023-11-26 DIAGNOSIS — R748 Abnormal levels of other serum enzymes: Secondary | ICD-10-CM

## 2023-11-26 DIAGNOSIS — R112 Nausea with vomiting, unspecified: Secondary | ICD-10-CM

## 2023-11-27 ENCOUNTER — Encounter: Payer: Self-pay | Admitting: Family Medicine

## 2023-11-27 ENCOUNTER — Ambulatory Visit (INDEPENDENT_AMBULATORY_CARE_PROVIDER_SITE_OTHER): Admitting: Family Medicine

## 2023-11-27 VITALS — BP 125/79 | HR 68 | Resp 16 | Ht 69.0 in | Wt 208.0 lb

## 2023-11-27 DIAGNOSIS — E559 Vitamin D deficiency, unspecified: Secondary | ICD-10-CM | POA: Diagnosis not present

## 2023-11-27 DIAGNOSIS — M6282 Rhabdomyolysis: Secondary | ICD-10-CM | POA: Diagnosis not present

## 2023-11-27 DIAGNOSIS — T796XXS Traumatic ischemia of muscle, sequela: Secondary | ICD-10-CM | POA: Diagnosis not present

## 2023-11-27 MED ORDER — VITAMIN D (ERGOCALCIFEROL) 1.25 MG (50000 UNIT) PO CAPS: 50000.0000 [IU] | ORAL_CAPSULE | ORAL | 1 refills | Status: DC

## 2023-11-27 NOTE — Patient Instructions (Addendum)
 I appreciate the opportunity to provide care to you today!   Labs: please stop by the lab today to get your blood drawn (CBC, CMP)    Please continue to a heart-healthy diet and increase your physical activities. Try to exercise for at least five days a week.    It was a pleasure to see you and I look forward to continuing to work together on your health and well-being. Please do not hesitate to call the office if you need care or have questions about your care.  In case of emergency, please visit the Emergency Department for urgent care, or contact our clinic at [phone] to schedule an appointment. We're here to help you!   Have a wonderful day and week. With Gratitude, Gilmore Laroche MSN, FNP-BC

## 2023-11-27 NOTE — Assessment & Plan Note (Signed)
 Will reassess CBC, CMP, and CK levels today. Patient is encouraged to drink at least 64 ounces of water daily, eat a well-balanced diet, and engage in moderate exercise. Advised to follow up with GI as scheduled.

## 2023-11-27 NOTE — Progress Notes (Signed)
 Established Patient Office Visit  Subjective:  Patient ID: Kenneth Chang, male    DOB: 1991-05-30  Age: 33 y.o. MRN: [id number]  CC:  Chief Complaint  Patient presents with   Hospitalization Follow-up    D/c 3/22. States he recernly had an [place] ordered by GI and that it was normal but they still want him to have a CT in a few weeks because his lipase was elevated at the hospital    HPI Kenneth Chang is a 33 y.o. male presents for ED f/u.    Rhabdomyolysis: The patient was seen in the ED on 11/14/2023 with complaints of nausea, vomiting, and abdominal pain. He reported experiencing some relief with hot showers, push-ups, and other physical movements to alleviate symptoms. Laboratory results showed an elevated creatinine level of 1.4 (baseline is normal), elevated bilirubin at 1.4, and an anion gap metabolic acidosis. Creatine kinase (CK) was elevated at 5,153. The patient responded well to IV hydration, Haldol, and Zofran, and was subsequently discharged home. Since discharge, the patient reports feeling well, with no recurrence of abdominal pain, nausea, or vomiting. He noted that Zofran provided minimal relief, so he discontinued its use; however, he currently denies any nausea. He reports adequate oral intake and hydration. No fever or systemic symptoms are reported.The patient has a repeat CT scan scheduled by GI for 01/07/2024. Lipase levels from 11/14/2023 were stable.    Past Medical History:  Diagnosis Date   Prediabetes    Vitamin D deficiency     Past Surgical History:  Procedure Laterality Date   BIOPSY  12/27/2022   Procedure: BIOPSY;  Surgeon: Lanelle Bal, DO;  Location: [facility];  Service: Endoscopy;;   ESOPHAGOGASTRODUODENOSCOPY (EGD) WITH PROPOFOL N/A 12/27/2022   Procedure: ESOPHAGOGASTRODUODENOSCOPY (EGD) WITH PROPOFOL;  Surgeon: Lanelle Bal, DO;  Location: [facility];  Service: Endoscopy;  Laterality: N/A;  12:30 pm   HERNIA REPAIR      Family  History  Problem Relation Age of Onset   Prostate cancer Maternal Grandfather    Colon cancer Neg Hx    Crohn's disease Neg Hx     Social History   Socioeconomic History   Marital status: Single    Spouse name: Not on file   Number of children: Not on file   Years of education: Not on file   Highest education level: Not on file  Occupational History   Not on file  Tobacco Use   Smoking status: Every Day    Types: Cigars    Passive exposure: Current   Smokeless tobacco: Never  Vaping Use   Vaping status: Every Day  Substance and Sexual Activity   Alcohol use: Not Currently    Comment: socially   Drug use: Not Currently    Types: Marijuana   Sexual activity: Yes  Other Topics Concern   Not on file  Social History Narrative   Not on file   Social Drivers of Health   Financial Resource Strain: Not on file  Food Insecurity: No Food Insecurity (11/14/2023)   Hunger Vital Sign    Worried About Running Out of Food in the Last Year: Never true    Ran Out of Food in the Last Year: Never true  Transportation Needs: No Transportation Needs (11/14/2023)   PRAPARE - Administrator, Civil Service (Medical): No    Lack of Transportation (Non-Medical): No  Physical Activity: Not on file  Stress: Not on file  Social Connections: Not on  file  Intimate Partner Violence: Not At Risk (11/14/2023)   Humiliation, Afraid, Rape, and Kick questionnaire    Fear of Current or Ex-Partner: No    Emotionally Abused: No    Physically Abused: No    Sexually Abused: No    Outpatient Medications Prior to Visit  Medication Sig Dispense Refill   ibuprofen (ADVIL) 200 MG tablet Take 800 mg by mouth every 6 (six) hours as needed for moderate pain.     pantoprazole (PROTONIX) 40 MG tablet Take 1 tablet (40 mg total) by mouth daily before breakfast. 90 tablet 3   Vitamin D, Ergocalciferol, (DRISDOL) 1.25 MG (50000 UNIT) CAPS capsule Take 1 capsule (50,000 Units total) by mouth every 7  (seven) days. 20 capsule 1   ondansetron (ZOFRAN) 4 MG tablet Take 1 tablet (4 mg total) by mouth every 6 (six) hours as needed for nausea or vomiting. (Patient not taking: Reported on 11/27/2023) 30 tablet 1   No facility-administered medications prior to visit.    No Known Allergies  ROS Review of Systems  Constitutional:  Negative for fatigue and fever.  Eyes:  Negative for visual disturbance.  Respiratory:  Negative for chest tightness and shortness of breath.   Cardiovascular:  Negative for chest pain and palpitations.  Neurological:  Negative for dizziness and headaches.      Objective:    Physical Exam HENT:     Head: Normocephalic.     Right Ear: External ear normal.     Left Ear: External ear normal.     Nose: No congestion or rhinorrhea.     Mouth/Throat:     Mouth: Mucous membranes are moist.  Cardiovascular:     Rate and Rhythm: Regular rhythm.     Heart sounds: No murmur heard. Pulmonary:     Effort: No respiratory distress.     Breath sounds: Normal breath sounds.  Neurological:     Mental Status: He is alert.     BP 125/79   Pulse 68   Resp 16   Ht 5\' 9"  (1.753 m)   Wt 208 lb (94.3 kg)   SpO2 96%   BMI 30.72 kg/m  Wt Readings from Last 3 Encounters:  11/27/23 208 lb (94.3 kg)  11/14/23 213 lb 10 oz (96.9 kg)  11/04/23 218 lb 9.6 oz (99.2 kg)    Lab Results  Component Value Date   TSH 1.620 08/04/2023   Lab Results  Component Value Date   WBC 8.7 11/14/2023   HGB 14.9 11/14/2023   HCT 43.7 11/14/2023   MCV 80.2 11/14/2023   PLT 262 11/14/2023   Lab Results  Component Value Date   NA 135 11/15/2023   K 3.7 11/15/2023   CO2 22 11/15/2023   GLUCOSE 83 11/15/2023   BUN 12 11/15/2023   CREATININE 1.09 11/15/2023   BILITOT 1.4 (H) 11/14/2023   ALKPHOS 46 11/14/2023   AST 113 (H) 11/14/2023   ALT 60 (H) 11/14/2023   PROT 7.8 11/14/2023   ALBUMIN 4.6 11/14/2023   CALCIUM 9.2 11/15/2023   ANIONGAP 8 11/15/2023   EGFR 84 11/05/2023    Lab Results  Component Value Date   CHOL 193 08/04/2023   Lab Results  Component Value Date   HDL 65 08/04/2023   Lab Results  Component Value Date   LDLCALC 111 (H) 08/04/2023   Lab Results  Component Value Date   TRIG 94 08/04/2023   Lab Results  Component Value Date   CHOLHDL 3.0 08/04/2023  Lab Results  Component Value Date   HGBA1C 5.9 (H) 08/04/2023      Assessment & Plan:  Traumatic rhabdomyolysis, sequela Assessment & Plan: Will reassess CBC, CMP, and CK levels today. Patient is encouraged to drink at least 64 ounces of water daily, eat a well-balanced diet, and engage in moderate exercise. Advised to follow up with GI as scheduled.    Orders: -     CMP14+EGFR -     CBC with Differential/Platelet -     CK total and CKMB (cardiac)not at [facility]  Vitamin D deficiency -     Vitamin D (Ergocalciferol); Take 1 capsule (50,000 Units total) by mouth every 7 (seven) days.  Dispense: 20 capsule; Refill: 1  Note: This chart has been completed using Engineer, civil (consulting) software, and while attempts have been made to ensure accuracy, certain words and phrases may not be transcribed as intended.    Follow-up: No follow-ups on file.   Gilmore Laroche, FNP

## 2023-11-28 LAB — CBC WITH DIFFERENTIAL/PLATELET
Basophils Absolute: 0 10*3/uL (ref 0.0–0.2)
Basos: 0 %
EOS (ABSOLUTE): 0.1 10*3/uL (ref 0.0–0.4)
Eos: 1 %
Hematocrit: 45.6 % (ref 37.5–51.0)
Hemoglobin: 14.6 g/dL (ref 13.0–17.7)
Immature Grans (Abs): 0 10*3/uL (ref 0.0–0.1)
Immature Granulocytes: 0 %
Lymphocytes Absolute: 2.7 10*3/uL (ref 0.7–3.1)
Lymphs: 39 %
MCH: 26.5 pg — ABNORMAL LOW (ref 26.6–33.0)
MCHC: 32 g/dL (ref 31.5–35.7)
MCV: 83 fL (ref 79–97)
Monocytes Absolute: 0.7 10*3/uL (ref 0.1–0.9)
Monocytes: 10 %
Neutrophils Absolute: 3.5 10*3/uL (ref 1.4–7.0)
Neutrophils: 50 %
Platelets: 318 10*3/uL (ref 150–450)
RBC: 5.5 x10E6/uL (ref 4.14–5.80)
RDW: 13.5 % (ref 11.6–15.4)
WBC: 7.1 10*3/uL (ref 3.4–10.8)

## 2023-11-28 LAB — CMP14+EGFR
ALT: 20 IU/L (ref 0–44)
AST: 16 IU/L (ref 0–40)
Albumin: 4.5 g/dL (ref 4.1–5.1)
Alkaline Phosphatase: 79 IU/L (ref 44–121)
BUN/Creatinine Ratio: 13 (ref 9–20)
BUN: 15 mg/dL (ref 6–20)
Bilirubin Total: 0.3 mg/dL (ref 0.0–1.2)
CO2: 24 mmol/L (ref 20–29)
Calcium: 9.9 mg/dL (ref 8.7–10.2)
Chloride: 104 mmol/L (ref 96–106)
Creatinine, Ser: 1.13 mg/dL (ref 0.76–1.27)
Globulin, Total: 2 g/dL (ref 1.5–4.5)
Glucose: 91 mg/dL (ref 70–99)
Potassium: 3.7 mmol/L (ref 3.5–5.2)
Sodium: 141 mmol/L (ref 134–144)
Total Protein: 6.5 g/dL (ref 6.0–8.5)
eGFR: 89 mL/min/{1.73_m2} (ref >59)

## 2023-11-28 LAB — CK TOTAL AND CKMB (NOT AT ARMC)
CK-MB Index: 2.3 ng/mL (ref 0.0–10.4)
Total CK: 175 U/L (ref 49–439)

## 2023-11-30 ENCOUNTER — Encounter: Payer: Self-pay | Admitting: Family Medicine

## 2023-12-05 ENCOUNTER — Ambulatory Visit: Payer: 59 | Admitting: Family Medicine

## 2024-01-07 ENCOUNTER — Ambulatory Visit (HOSPITAL_COMMUNITY)
Admission: RE | Admit: 2024-01-07 | Discharge: 2024-01-07 | Disposition: A | Discharge status: Home / Self Care | Source: Ambulatory Visit | Attending: Gastroenterology | Admitting: Gastroenterology

## 2024-01-07 ENCOUNTER — Encounter (HOSPITAL_COMMUNITY): Payer: Self-pay | Admitting: Radiology

## 2024-01-07 DIAGNOSIS — R112 Nausea with vomiting, unspecified: Secondary | ICD-10-CM | POA: Diagnosis present

## 2024-01-07 DIAGNOSIS — R748 Abnormal levels of other serum enzymes: Secondary | ICD-10-CM | POA: Insufficient documentation

## 2024-01-07 MED ORDER — IOHEXOL 300 MG/ML  SOLN
100.0000 mL | Freq: Once | INTRAMUSCULAR | Status: AC | PRN
  Administered 2024-01-07: 100 mL via INTRAVENOUS

## 2024-01-12 ENCOUNTER — Ambulatory Visit: Payer: Self-pay | Admitting: Gastroenterology

## 2024-03-29 ENCOUNTER — Encounter: Payer: Self-pay | Admitting: Family Medicine

## 2024-03-29 ENCOUNTER — Ambulatory Visit (INDEPENDENT_AMBULATORY_CARE_PROVIDER_SITE_OTHER): Admitting: Family Medicine

## 2024-03-29 VITALS — BP 134/85 | HR 66 | Resp 18 | Ht 69.0 in | Wt 226.0 lb

## 2024-03-29 DIAGNOSIS — R7301 Impaired fasting glucose: Secondary | ICD-10-CM

## 2024-03-29 DIAGNOSIS — E559 Vitamin D deficiency, unspecified: Secondary | ICD-10-CM

## 2024-03-29 DIAGNOSIS — E785 Hyperlipidemia, unspecified: Secondary | ICD-10-CM

## 2024-03-29 DIAGNOSIS — E038 Other specified hypothyroidism: Secondary | ICD-10-CM

## 2024-03-29 DIAGNOSIS — E7849 Other hyperlipidemia: Secondary | ICD-10-CM

## 2024-03-29 NOTE — Progress Notes (Unsigned)
 Established Patient Office Visit  Subjective:  Patient ID: Kenneth Chang, male    DOB: 1990-09-01  Age: 33 y.o. MRN: [id number]  CC: No chief complaint on file.   HPI Kenneth Chang is a 33 y.o. male with past medical history of *** presents for f/u of *** chronic medical conditions.  Past Medical History:  Diagnosis Date   Prediabetes    Vitamin D  deficiency     Past Surgical History:  Procedure Laterality Date   BIOPSY  12/27/2022   Procedure: BIOPSY;  Surgeon: Cindie Carlin POUR, DO;  [facility];  Service: Endoscopy;;   ESOPHAGOGASTRODUODENOSCOPY (EGD) WITH PROPOFOL  N/A 12/27/2022   Procedure: ESOPHAGOGASTRODUODENOSCOPY (EGD) WITH PROPOFOL ;  Surgeon: Cindie Carlin POUR, DO;  Location: [facility];  Service: Endoscopy;  Laterality: N/A;  12:30 pm   HERNIA REPAIR      Family History  Problem Relation Age of Onset   Prostate cancer Maternal Grandfather    Colon cancer Neg Hx    Crohn's disease Neg Hx     Social History   Socioeconomic History   Marital status: Single    Spouse name: Not on file   Number of children: Not on file   Years of education: Not on file   Highest education level: Not on file  Occupational History   Not on file  Tobacco Use   Smoking status: Every Day    Types: Cigars    Passive exposure: Current   Smokeless tobacco: Never  Vaping Use   Vaping status: Every Day  Substance and Sexual Activity   Alcohol use: Not Currently    Comment: socially   Drug use: Not Currently    Types: Marijuana   Sexual activity: Yes  Other Topics Concern   Not on file  Social History Narrative   Not on file   Social Drivers of Health   Financial Resource Strain: Not on file  Food Insecurity: No Food Insecurity (11/14/2023)   Hunger Vital Sign    Worried About Running Out of Food in the Last Year: Never true    Ran Out of Food in the Last Year: Never true  Transportation Needs: No Transportation Needs (11/14/2023)   PRAPARE -  Administrator, Civil Service (Medical): No    Lack of Transportation (Non-Medical): No  Physical Activity: Not on file  Stress: Not on file  Social Connections: Not on file  Intimate Partner Violence: Not At Risk (11/14/2023)   Humiliation, Afraid, Rape, and Kick questionnaire    Fear of Current or Ex-Partner: No    Emotionally Abused: No    Physically Abused: No    Sexually Abused: No    Outpatient Medications Prior to Visit  Medication Sig Dispense Refill   ibuprofen (ADVIL) 200 MG tablet Take 800 mg by mouth every 6 (six) hours as needed for moderate pain.     ondansetron  (ZOFRAN ) 4 MG tablet Take 1 tablet (4 mg total) by mouth every 6 (six) hours as needed for nausea or vomiting. (Patient not taking: Reported on 11/27/2023) 30 tablet 1   pantoprazole  (PROTONIX ) 40 MG tablet Take 1 tablet (40 mg total) by mouth daily before breakfast. 90 tablet 3   Vitamin D , Ergocalciferol , (DRISDOL ) 1.25 MG (50000 UNIT) CAPS capsule Take 1 capsule (50,000 Units total) by mouth every 7 (seven) days. 20 capsule 1   No facility-administered medications prior to visit.    No Known Allergies  ROS Review of Systems    Objective:  Physical Exam  There were no vitals taken for this visit. Wt Readings from Last 3 Encounters:  11/27/23 208 lb (94.3 kg)  11/14/23 213 lb 10 oz (96.9 kg)  11/04/23 218 lb 9.6 oz (99.2 kg)    Lab Results  Component Value Date   TSH 1.620 08/04/2023   Lab Results  Component Value Date   WBC 7.1 11/27/2023   HGB 14.6 11/27/2023   HCT 45.6 11/27/2023   MCV 83 11/27/2023   PLT 318 11/27/2023   Lab Results  Component Value Date   NA 141 11/27/2023   K 3.7 11/27/2023   CO2 24 11/27/2023   GLUCOSE 91 11/27/2023   BUN 15 11/27/2023   CREATININE 1.13 11/27/2023   BILITOT 0.3 11/27/2023   ALKPHOS 79 11/27/2023   AST 16 11/27/2023   ALT 20 11/27/2023   PROT 6.5 11/27/2023   ALBUMIN 4.5 11/27/2023   CALCIUM 9.9 11/27/2023   ANIONGAP 8  11/15/2023   EGFR 89 11/27/2023   Lab Results  Component Value Date   CHOL 193 08/04/2023   Lab Results  Component Value Date   HDL 65 08/04/2023   Lab Results  Component Value Date   LDLCALC 111 (H) 08/04/2023   Lab Results  Component Value Date   TRIG 94 08/04/2023   Lab Results  Component Value Date   CHOLHDL 3.0 08/04/2023   Lab Results  Component Value Date   HGBA1C 5.9 (H) 08/04/2023      Assessment & Plan:  There are no diagnoses linked to this encounter.  Follow-up: No follow-ups on file.   Lanaya Bennis, FNP

## 2024-03-29 NOTE — Patient Instructions (Signed)
 I appreciate the opportunity to provide care to you today!    Follow up:  5 months  Labs: please stop by the lab today to get your blood drawn (CBC, CMP, TSH, Lipid profile, HgA1c, Vit D)  For a Healthier YOU, I Recommend: Reducing your intake of sugar, sodium, carbohydrates, and saturated fats. Increasing your fiber intake by incorporating more whole grains, fruits, and vegetables into your meals. Setting healthy goals with a focus on lowering your consumption of carbs, sugar, and unhealthy fats. Adding variety to your diet by including a wide range of fruits and vegetables. Cutting back on soda and limiting processed foods as much as possible. Staying active: In addition to taking your weight loss medication, aim for at least 150 minutes of moderate-intensity physical activity each week for optimal results.   Please follow up if your symptoms worsen or fail to improve.     Please continue to a heart-healthy diet and increase your physical activities. Try to exercise for at least five days a week.    It was a pleasure to see you and I look forward to continuing to work together on your health and well-being. Please do not hesitate to call the office if you need care or have questions about your care.  In case of emergency, please visit the Emergency Department for urgent care, or contact our clinic at [phone] to schedule an appointment. We're here to help you!   Have a wonderful day and week. With Gratitude, Tniya Bowditch MSN, FNP-BC

## 2024-03-30 LAB — CBC WITH DIFFERENTIAL/PLATELET
Basophils Absolute: 0 x10E3/uL (ref 0.0–0.2)
Basos: 0 %
EOS (ABSOLUTE): 0.2 x10E3/uL (ref 0.0–0.4)
Eos: 4 %
Hematocrit: 44.9 % (ref 37.5–51.0)
Hemoglobin: 14.8 g/dL (ref 13.0–17.7)
Immature Grans (Abs): 0 x10E3/uL (ref 0.0–0.1)
Immature Granulocytes: 0 %
Lymphocytes Absolute: 2.1 x10E3/uL (ref 0.7–3.1)
Lymphs: 37 %
MCH: 27.1 pg (ref 26.6–33.0)
MCHC: 33 g/dL (ref 31.5–35.7)
MCV: 82 fL (ref 79–97)
Monocytes Absolute: 0.6 x10E3/uL (ref 0.1–0.9)
Monocytes: 11 %
Neutrophils Absolute: 2.6 x10E3/uL (ref 1.4–7.0)
Neutrophils: 48 %
Platelets: 243 x10E3/uL (ref 150–450)
RBC: 5.47 x10E6/uL (ref 4.14–5.80)
RDW: 14.3 % (ref 11.6–15.4)
WBC: 5.5 x10E3/uL (ref 3.4–10.8)

## 2024-03-30 LAB — CMP14+EGFR
ALT: 114 IU/L — ABNORMAL HIGH (ref 0–44)
AST: 184 IU/L — ABNORMAL HIGH (ref 0–40)
Albumin: 4.4 g/dL (ref 4.1–5.1)
Alkaline Phosphatase: 86 IU/L (ref 44–121)
BUN/Creatinine Ratio: 12 (ref 9–20)
BUN: 13 mg/dL (ref 6–20)
Bilirubin Total: 0.4 mg/dL (ref 0.0–1.2)
CO2: 20 mmol/L (ref 20–29)
Calcium: 9.6 mg/dL (ref 8.7–10.2)
Chloride: 105 mmol/L (ref 96–106)
Creatinine, Ser: 1.13 mg/dL (ref 0.76–1.27)
Globulin, Total: 2.1 g/dL (ref 1.5–4.5)
Glucose: 103 mg/dL — ABNORMAL HIGH (ref 70–99)
Potassium: 4.2 mmol/L (ref 3.5–5.2)
Sodium: 140 mmol/L (ref 134–144)
Total Protein: 6.5 g/dL (ref 6.0–8.5)
eGFR: 89 mL/min/1.73 (ref >59)

## 2024-03-30 LAB — LIPID PANEL
Chol/HDL Ratio: 2.8 ratio (ref 0.0–5.0)
Cholesterol, Total: 223 mg/dL — ABNORMAL HIGH (ref 100–199)
HDL: 80 mg/dL (ref >39)
LDL Chol Calc (NIH): 125 mg/dL — ABNORMAL HIGH (ref 0–99)
Triglycerides: 101 mg/dL (ref 0–149)
VLDL Cholesterol Cal: 18 mg/dL (ref 5–40)

## 2024-03-30 LAB — HEMOGLOBIN A1C
Est. average glucose Bld gHb Est-mCnc: 117 mg/dL
Hgb A1c MFr Bld: 5.7 % — ABNORMAL HIGH (ref 4.8–5.6)

## 2024-03-30 LAB — VITAMIN D 25 HYDROXY (VIT D DEFICIENCY, FRACTURES): Vit D, 25-Hydroxy: 23.8 ng/mL — ABNORMAL LOW (ref 30.0–100.0)

## 2024-03-30 LAB — TSH+FREE T4
Free T4: 1.02 ng/dL (ref 0.82–1.77)
TSH: 1.24 u[IU]/mL (ref 0.450–4.500)

## 2024-03-30 NOTE — Assessment & Plan Note (Signed)
 Pending vit d levels Encouraged to increase his intake of vitamin D -rich foods such as fatty fish (e.g., salmon, mackerel, and sardines), fortified dairy products, egg yolks, and fortified cereals.  Last vitamin D  Lab Results  Component Value Date   VD25OH 23.8 (L) 03/29/2024

## 2024-03-30 NOTE — Assessment & Plan Note (Signed)
 The patient's LDL goal is to be less than 100. The patient was educated on lifestyle modifications, including decreasing the intake of greasy, fatty, and starchy foods, while increasing physical activity. The patient verbalized understanding and is aware of the plan of care. Lab Results  Component Value Date   CHOL 223 (H) 03/29/2024   HDL 80 03/29/2024   LDLCALC 125 (H) 03/29/2024   TRIG 101 03/29/2024   CHOLHDL 2.8 03/29/2024

## 2024-04-01 ENCOUNTER — Ambulatory Visit: Payer: Self-pay | Admitting: Family Medicine

## 2024-04-01 DIAGNOSIS — E559 Vitamin D deficiency, unspecified: Secondary | ICD-10-CM

## 2024-04-01 DIAGNOSIS — R748 Abnormal levels of other serum enzymes: Secondary | ICD-10-CM

## 2024-04-01 MED ORDER — VITAMIN D (ERGOCALCIFEROL) 1.25 MG (50000 UNIT) PO CAPS: 50000.0000 [IU] | ORAL_CAPSULE | ORAL | 1 refills | Status: AC

## 2024-08-30 ENCOUNTER — Ambulatory Visit: Admitting: Family Medicine
